# Patient Record
Sex: Male | Born: 1955 | Race: White | Hispanic: No | Marital: Married | Smoking: Never smoker
Health system: Southern US, Community
[De-identification: ages and names within clinical notes are randomized; demographics above are authoritative.]

## PROBLEM LIST (undated history)

## (undated) DIAGNOSIS — Z87442 Personal history of urinary calculi: Secondary | ICD-10-CM

## (undated) DIAGNOSIS — R351 Nocturia: Secondary | ICD-10-CM

## (undated) DIAGNOSIS — C61 Malignant neoplasm of prostate: Secondary | ICD-10-CM

## (undated) DIAGNOSIS — K219 Gastro-esophageal reflux disease without esophagitis: Secondary | ICD-10-CM

## (undated) DIAGNOSIS — I10 Essential (primary) hypertension: Secondary | ICD-10-CM

## (undated) DIAGNOSIS — Z8739 Personal history of other diseases of the musculoskeletal system and connective tissue: Secondary | ICD-10-CM

## (undated) HISTORY — PX: CYSTOSCOPY/RETROGRADE/URETEROSCOPY: SHX5316

---

## 2009-06-07 ENCOUNTER — Emergency Department (HOSPITAL_COMMUNITY): Admission: RE | Admit: 2009-06-07 | Discharge: 2009-06-07 | Payer: Self-pay | Admitting: Internal Medicine

## 2009-07-24 ENCOUNTER — Ambulatory Visit (HOSPITAL_COMMUNITY): Admission: RE | Admit: 2009-07-24 | Discharge: 2009-07-24 | Payer: Self-pay | Admitting: Urology

## 2009-07-31 ENCOUNTER — Ambulatory Visit (HOSPITAL_COMMUNITY): Admission: RE | Admit: 2009-07-31 | Discharge: 2009-07-31 | Payer: Self-pay | Admitting: Urology

## 2010-07-31 IMAGING — CT CT ABDOMEN W/O CM
2 of 4 series · 16 of 46 positions shown, 18 images · non-contrast
Comparison: None

CT ABDOMEN

CLINICAL DATA: Left flank pain.  History kidney stones.

CT OF THE ABDOMEN AND PELVIS WITHOUT CONTRAST (CT UROGRAM)
TECHNIQUE: Multidetector CT imaging was performed through the
abdomen and pelvis to include the urinary tract.

[Series 2: stone <45 and or <(id) 5.0 mm · axial · 0.79mm/px · z∈[-496,-36]mm · 13 of 102 slices shown, 15 images]
[im 5/102  soft-tissue]
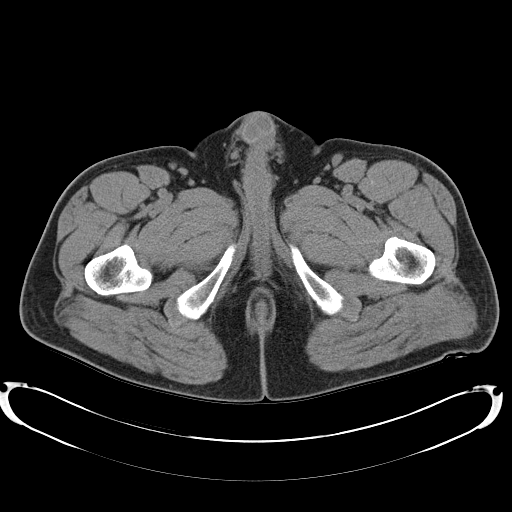
[im 5/102  bone]
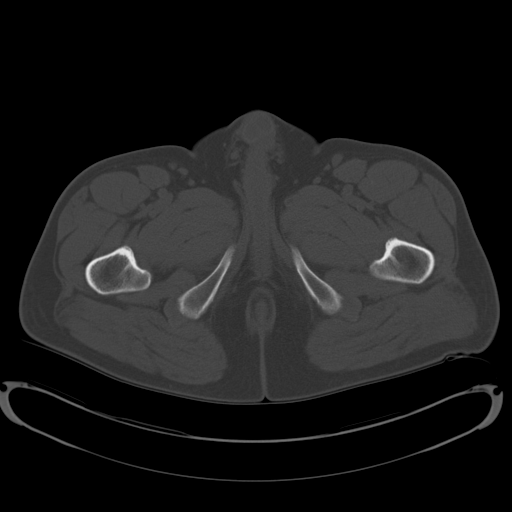
[im 13/102  soft-tissue]
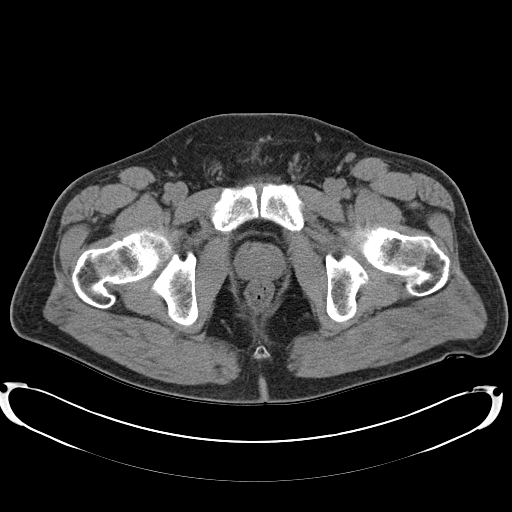
[im 21/102  soft-tissue]
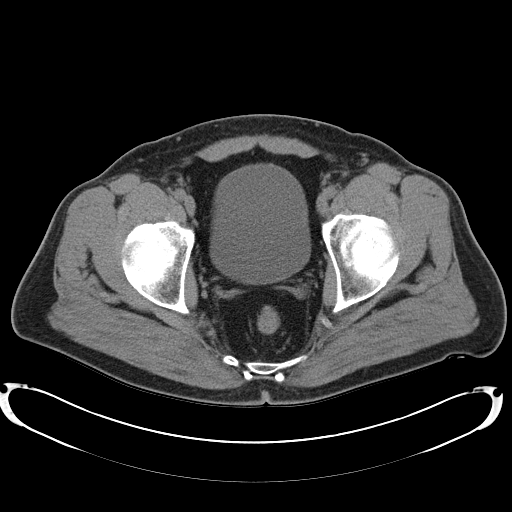
[im 29/102  soft-tissue]
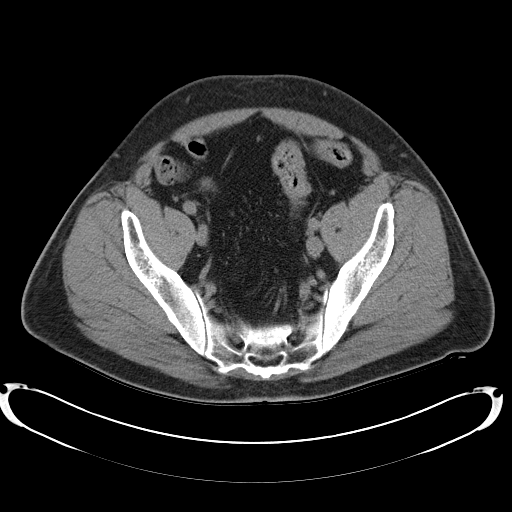
[im 37/102  soft-tissue]
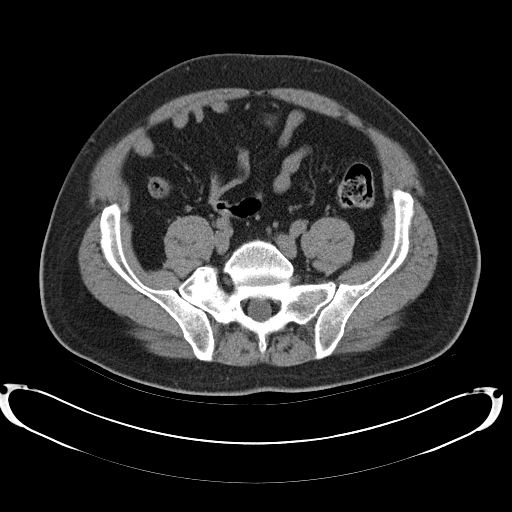
[im 45/102  soft-tissue]
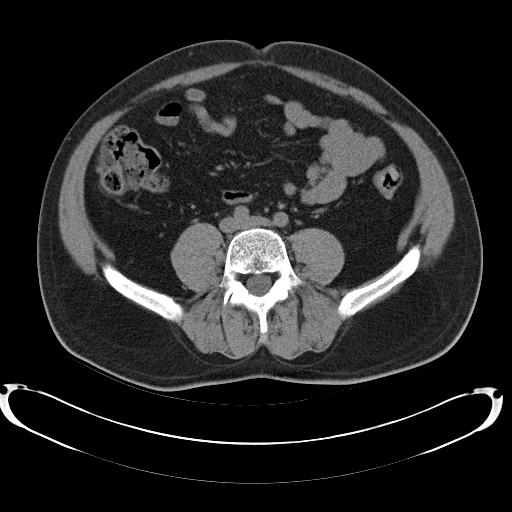
[im 53/102  soft-tissue]
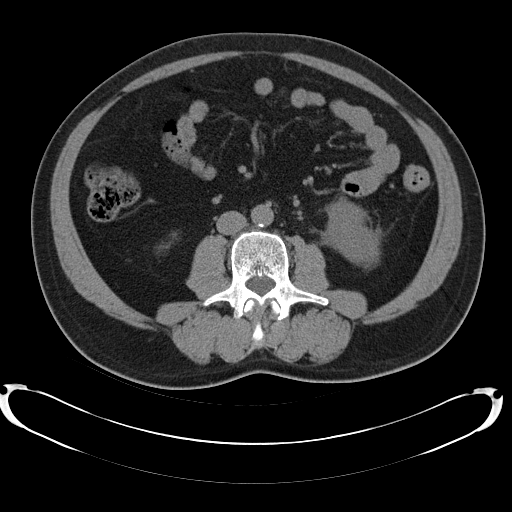
[im 57/102  soft-tissue]
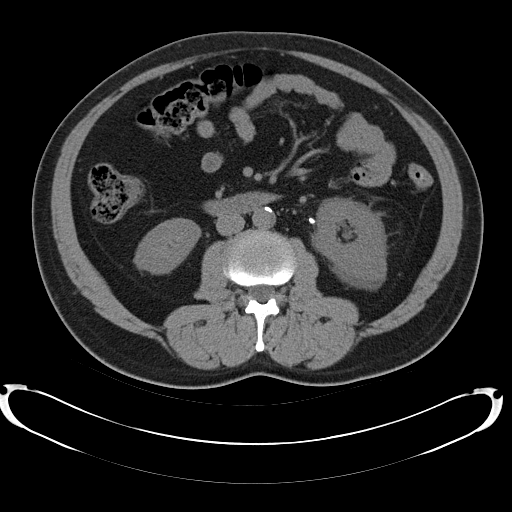
[im 65/102  soft-tissue]
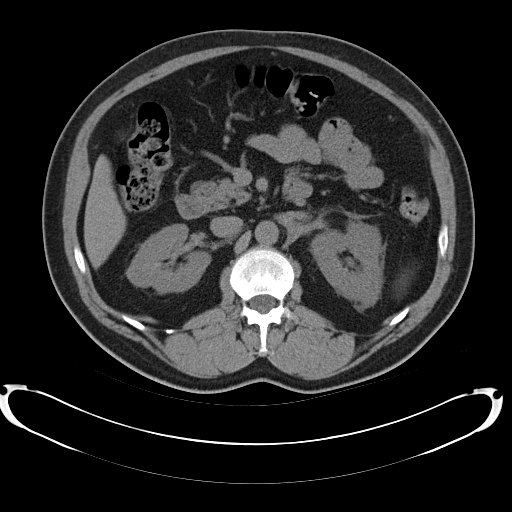
[im 65/102  bone]
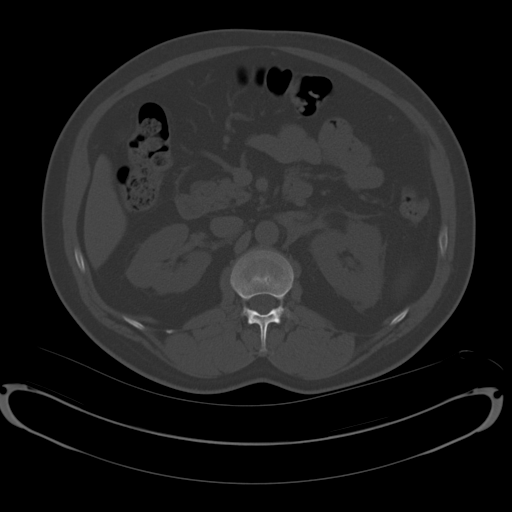
[im 73/102  soft-tissue]
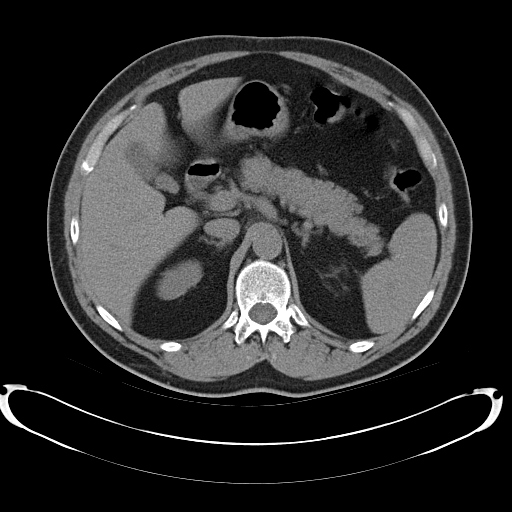
[im 81/102  soft-tissue]
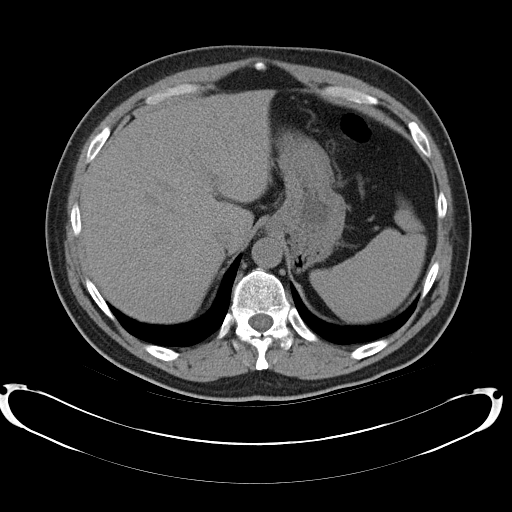
[im 89/102  soft-tissue]
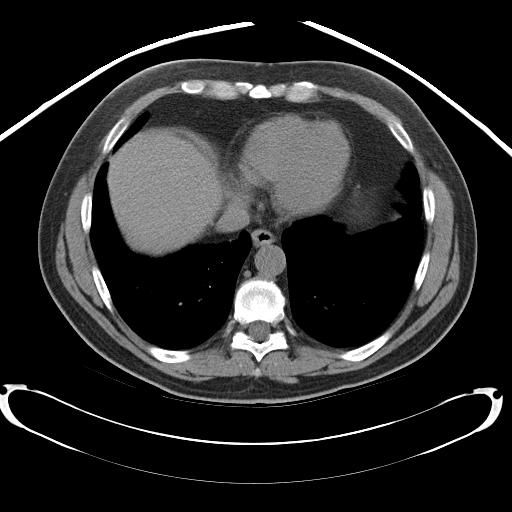
[im 97/102  soft-tissue]
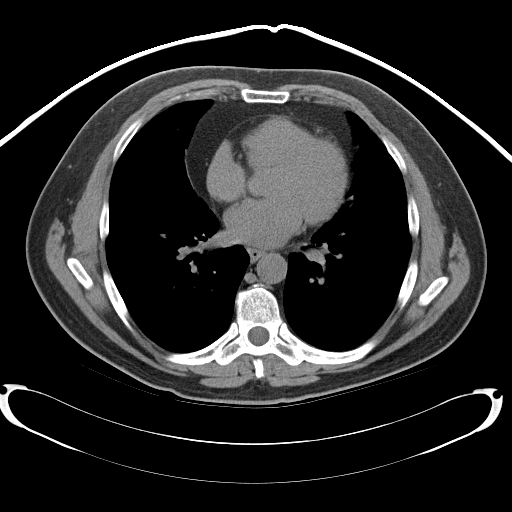

[Series 4: mpr coronal · coronal · 0.71mm/px · 3 of 90 slices shown]
[im 30/90  soft-tissue]
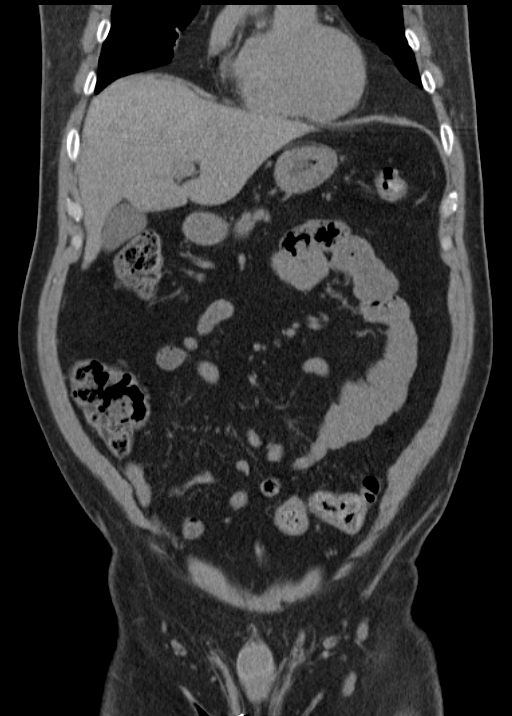
[im 40/90  soft-tissue]
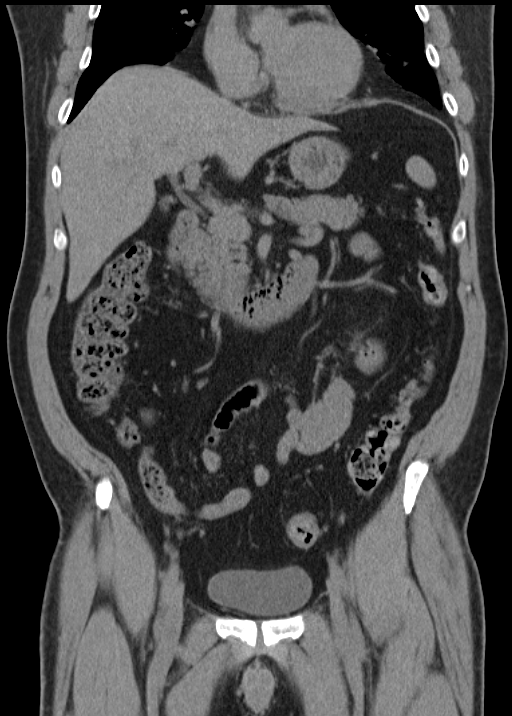
[im 50/90  soft-tissue]
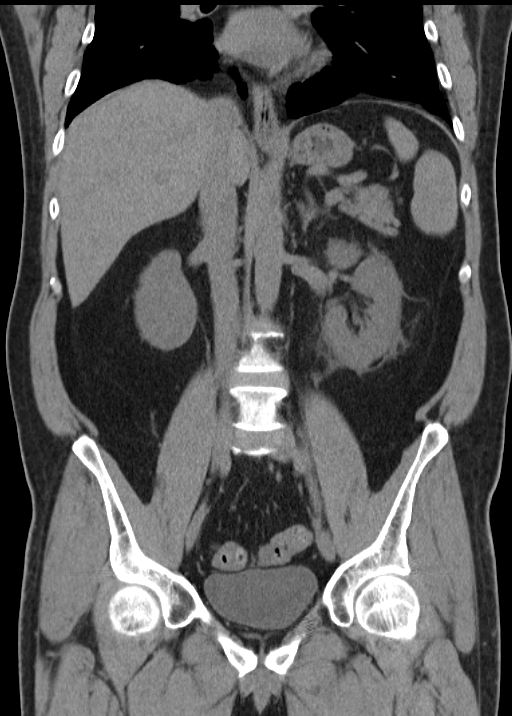

[16 of 46 positions shown; findings below may reference images not displayed]

FINDINGS: There is acute obstruction of the left kidney with
minimal hydronephrosis noted.  Streaky soft tissue densities
involve the perirenal fat.  Bilateral single small intrarenal
calculi lying within the collecting systems.  5 x 6 mm calculus in
the proximal left ureter.
IMPRESSION: Obstruction of the proximal left ureter by a small calculus.

CT PELVIS
FINDINGS: Normal urinary bladder.  Sigmoid diverticula.  No free
pelvic fluid.  No acute pelvic findings.  Subtle L5 pars defects
without slippage.
IMPRESSION: No acute pelvic findings.

## 2011-01-28 ENCOUNTER — Other Ambulatory Visit (HOSPITAL_COMMUNITY): Payer: Self-pay | Admitting: Urology

## 2011-01-28 DIAGNOSIS — N23 Unspecified renal colic: Secondary | ICD-10-CM

## 2011-01-30 ENCOUNTER — Other Ambulatory Visit (HOSPITAL_COMMUNITY): Payer: Self-pay

## 2011-04-06 LAB — BASIC METABOLIC PANEL
BUN: 14 mg/dL (ref 6–23)
CO2: 30 mEq/L (ref 19–32)
Calcium: 9.6 mg/dL (ref 8.4–10.5)
Chloride: 103 mEq/L (ref 96–112)
Creatinine, Ser: 1.15 mg/dL (ref 0.4–1.5)
GFR calc Af Amer: >60 mL/min (ref >60)
GFR calc non Af Amer: >60 mL/min (ref >60)
Glucose, Bld: 84 mg/dL (ref 70–99)
Potassium: 4.7 mEq/L (ref 3.5–5.1)
Sodium: 139 mEq/L (ref 135–145)

## 2011-04-06 LAB — HEMOGLOBIN AND HEMATOCRIT, BLOOD
HCT: 41.5 % (ref 39.0–52.0)
Hemoglobin: 14.9 g/dL (ref 13.0–17.0)

## 2011-05-13 NOTE — H&P (Signed)
[name], [name]             ACCOUNT NO.:  [id number]   MEDICAL RECORD NO.:  [id number]          PATIENT TYPE:  AMB   LOCATION:  DAY                           FACILITY:  [facility]   PHYSICIAN:  Ky Barban, M.D.DATE OF BIRTH:  [birth year]   DATE OF ADMISSION:  DATE OF DISCHARGE:  LH                              HISTORY & PHYSICAL   CHIEF COMPLAINT:  Left renal colic.   HISTORY:  A 55 year old gentleman was seen by me on June 23.  He went to  emergency room at [facility] with left renal colic on June 10.  CT scan  shows 5  x 6 mm stone in the left upper ureter causing no obstruction.  When I saw him he was not having much pain, so I treated him  conservatively,  put him on Flomax.  He came back to see me on July 27  and continued to have on and off pain and wants it get rid of the stone.  KUB showed the calcification which was in the left upper ureter.  It is  now in the area of the distal ureter, although I cannot be 100% sure  because just a KUB.  I told him that I will do a retrograde pyelogram  and if there is a stone we will go after that with a stone basket and  try to remove it.  I have explained the procedure limitation,  complications to the patient especially ureteral perforation leading to  open surgery.  No guarantees about the results.  He understands, wants  me to go ahead and proceed.  He also knows that I may have to use a  double-J stent.   PAST MEDICAL HISTORY:  He has passed a kidney stone several years ago.  No other medical problem.   PERSONAL HISTORY:  He does not smoke or drink.   REVIEW OF SYSTEMS:  Denies any chest pain, orthopnea, PND, nausea,  vomiting, diarrhea, constipation.   EXAMINATION:  Well-nourished, well-developed male not in acute distress.  Blood pressure 136/85, temperature 97.7.  CENTRAL NERVOUS SYSTEM:  Negative.  HEAD/NECK/ENT:  Negative.  CHEST:  Symmetrical.  HEART:  Regular sinus rhythm, no murmur.  ABDOMEN:  Soft, flat.   Liver, spleen, kidneys not palpable.  No CVA  tenderness.  EXTERNAL GENITALIA:  Circumcised, meatus adequate.  Testicles are  normal.  RECTAL EXAM:  Normal sphincter tone.  No rectal mass.  Prostate 1+  smooth and firm.   IMPRESSION:  He understands and wants me to go ahead.  He is coming as  an outpatient.  We will do the procedure tomorrow under anesthesia in  the hospital.      Ky Barban, M.D.  Electronically Signed     MIJ/MEDQ  D:  07/30/2009  T:  07/30/2009  Job:  [id number]

## 2011-05-13 NOTE — Op Note (Signed)
NAMEKEON, PENDER             ACCOUNT NO.:  [id number]   MEDICAL RECORD NO.:  [id number]          PATIENT TYPE:  AMB   LOCATION:  DAY                           FACILITY:  [facility]   PHYSICIAN:  Ky Barban, M.D.DATE OF BIRTH:  02/27/1956   DATE OF PROCEDURE:  07/31/2009  DATE OF DISCHARGE:                               OPERATIVE REPORT   PREOPERATIVE DIAGNOSIS:  Left ureteral calculus.   POSTOPERATIVE DIAGNOSIS:  No ureteral calculus.   PROCEDURE:  Cystoscopy, left retrograde pyelogram, left ureteroscopy.   ANESTHESIA:  General.   PROCEDURE:  The patient placed under general anesthesia in lithotomy  position.  After, usual prep and drape #25 cystoscope introduced into  the bladder.  Bladder was inspected.  The left ureteral orifice looks  quite inflamed of the meatus and it was catheterized with a wedge  catheter.  Hypaque was injected under fluoroscopic control.  The ureter  was dilated moderately above the ureteropelvic junction, but I do not  see any filling defect.  Rest of the ureter also looks normal.  I  decided to look into the ureter.  A guidewire was passed up into the  renal pelvis, over the guidewire #15 balloon dilator was introduced into  the intramural ureter and it is dilated.  Then, over the guidewire short  rigid ureteroscope was introduced and the distal ureter was inspected.  There was a lot of inflammation in the intramural ureter, but no  definite stone.  Rest of the ureter also looks normal.  No stone, and I  think he recently passed that stone and the bladder was thoroughly  inspected.  There is no stone in the bladder either.  All the  instruments were removed.  The patient left the operating room in the  satisfactory condition.      Ky Barban, M.D.  Electronically Signed     MIJ/MEDQ  D:  07/31/2009  T:  07/31/2009  Job:  [id number]

## 2011-10-16 ENCOUNTER — Encounter (INDEPENDENT_AMBULATORY_CARE_PROVIDER_SITE_OTHER): Payer: Self-pay | Admitting: *Deleted

## 2013-06-15 ENCOUNTER — Other Ambulatory Visit (HOSPITAL_COMMUNITY): Payer: Self-pay | Admitting: Cardiovascular Disease

## 2013-06-15 DIAGNOSIS — R079 Chest pain, unspecified: Secondary | ICD-10-CM

## 2013-06-15 DIAGNOSIS — R011 Cardiac murmur, unspecified: Secondary | ICD-10-CM

## 2013-06-16 ENCOUNTER — Other Ambulatory Visit (HOSPITAL_COMMUNITY): Payer: Self-pay | Admitting: Cardiovascular Disease

## 2013-06-16 ENCOUNTER — Encounter: Payer: Self-pay | Admitting: Cardiovascular Disease

## 2013-06-16 DIAGNOSIS — R011 Cardiac murmur, unspecified: Secondary | ICD-10-CM

## 2013-06-16 DIAGNOSIS — R079 Chest pain, unspecified: Secondary | ICD-10-CM

## 2013-06-23 ENCOUNTER — Other Ambulatory Visit: Payer: Self-pay | Admitting: Cardiovascular Disease

## 2013-06-23 ENCOUNTER — Ambulatory Visit (HOSPITAL_COMMUNITY): Payer: Self-pay

## 2013-06-23 ENCOUNTER — Ambulatory Visit (HOSPITAL_COMMUNITY)
Admission: RE | Admit: 2013-06-23 | Discharge: 2013-06-23 | Disposition: A | Discharge status: Home / Self Care | Payer: BC Managed Care – PPO | Source: Ambulatory Visit | Attending: Internal Medicine | Admitting: Internal Medicine

## 2013-06-23 DIAGNOSIS — I2510 Atherosclerotic heart disease of native coronary artery without angina pectoris: Secondary | ICD-10-CM | POA: Insufficient documentation

## 2013-06-23 DIAGNOSIS — R011 Cardiac murmur, unspecified: Secondary | ICD-10-CM

## 2013-06-23 DIAGNOSIS — I10 Essential (primary) hypertension: Secondary | ICD-10-CM | POA: Insufficient documentation

## 2013-06-23 DIAGNOSIS — R079 Chest pain, unspecified: Secondary | ICD-10-CM | POA: Insufficient documentation

## 2013-06-23 DIAGNOSIS — I379 Nonrheumatic pulmonary valve disorder, unspecified: Secondary | ICD-10-CM | POA: Insufficient documentation

## 2013-06-23 DIAGNOSIS — I059 Rheumatic mitral valve disease, unspecified: Secondary | ICD-10-CM | POA: Insufficient documentation

## 2013-06-23 NOTE — Progress Notes (Signed)
2D Echo Performed 06/23/2013    Jestine Bicknell, RCS  

## 2013-07-07 ENCOUNTER — Ambulatory Visit (HOSPITAL_COMMUNITY)
Admission: RE | Admit: 2013-07-07 | Discharge: 2013-07-07 | Disposition: A | Discharge status: Home / Self Care | Payer: BC Managed Care – PPO | Source: Ambulatory Visit | Attending: Cardiovascular Disease | Admitting: Cardiovascular Disease

## 2013-07-07 DIAGNOSIS — I10 Essential (primary) hypertension: Secondary | ICD-10-CM | POA: Insufficient documentation

## 2013-07-07 DIAGNOSIS — R079 Chest pain, unspecified: Secondary | ICD-10-CM | POA: Insufficient documentation

## 2013-07-07 DIAGNOSIS — Z8249 Family history of ischemic heart disease and other diseases of the circulatory system: Secondary | ICD-10-CM | POA: Insufficient documentation

## 2013-07-07 DIAGNOSIS — R0989 Other specified symptoms and signs involving the circulatory and respiratory systems: Secondary | ICD-10-CM | POA: Insufficient documentation

## 2013-07-07 DIAGNOSIS — R0609 Other forms of dyspnea: Secondary | ICD-10-CM | POA: Insufficient documentation

## 2013-07-07 MED ORDER — TECHNETIUM TC 99M SESTAMIBI GENERIC - CARDIOLITE
10.1000 | Freq: Once | INTRAVENOUS | Status: AC | PRN
  Administered 2013-07-07: 10.1 via INTRAVENOUS

## 2013-07-07 MED ORDER — TECHNETIUM TC 99M SESTAMIBI GENERIC - CARDIOLITE
30.6000 | Freq: Once | INTRAVENOUS | Status: AC | PRN
  Administered 2013-07-07: 31 via INTRAVENOUS

## 2013-07-07 NOTE — Procedures (Addendum)
[facility] [address] [name] [place] [address] [phone]  Cardiology Nuclear Med Study  [name] is a 57 y.o. male     MRN : [id number]     DOB: [birth year]  Procedure Date: 07/07/2013  Nuclear Med Background Indication for Stress Test:  Evaluation for Ischemia History:  NO PRIOR HISTORY REPORTED Cardiac Risk Factors: Family History - CAD, Hypertension, Lipids and Overweight  Symptoms:  Chest Pain and DOE   Nuclear Pre-Procedure Caffeine/Decaff Intake:  7:00pm NPO After: 5:00am   IV Site: R Forearm  IV 0.9% NS with Angio Cath:  22g  Chest Size (in):  44"  IV Started by: Emmit Pomfret, RN  Height: 5\' 9"  (1.753 m)  Cup Size: n/a  BMI:  Body mass index is 28.78 kg/(m^2). Weight:  195 lb (88.451 kg)   Tech Comments:  N/A    Nuclear Med Study 1 or 2 day study: 1 day  Stress Test Type:  Stress  Order Authorizing Provider:  Susa Griffins, MD   Resting Radionuclide: Technetium 33m Sestamibi  Resting Radionuclide Dose: 10.1 mCi   Stress Radionuclide:  Technetium 25m Sestamibi  Stress Radionuclide Dose: 30.6 mCi           Stress Protocol Rest HR: 67 Stress HR: 155  Rest BP: 142/91 Stress BP: 204/79  Exercise Time (min): 10:43 METS: 12.9   Predicted Max HR: 162 bpm % Max HR: 95.68 bpm Rate Pressure Product: 03474  Dose of Adenosine (mg):  n/a Dose of Lexiscan: n/a mg  Dose of Atropine (mg): n/a Dose of Dobutamine: n/a mcg/kg/min (at max HR)  Stress Test Technologist: Esperanza Sheets, CCT Nuclear Technologist: Gonzella Lex, CNMT   Rest Procedure:  Myocardial perfusion imaging was performed at rest 45 minutes following the intravenous administration of Technetium 67m Sestamibi. Stress Procedure:  The patient performed treadmill exercise using a Bruce  Protocol for 10:43 minutes. The patient stopped due to SOB and fatigue and denied any chest pain.  There were no significant ST-T wave changes.  Technetium  45m Sestamibi was injected at peak exercise and myocardial perfusion imaging was performed after a brief delay.  Transient Ischemic Dilatation (Normal <1.22):  0.88 Lung/Heart Ratio (Normal <0.45):  0.40 QGS EDV:  63 ml QGS ESV:  20 ml LV Ejection Fraction: 69%  Signed by       Rest ECG: NSR - Normal EKG  Stress ECG: No significant change from baseline ECG  QPS Raw Data Images:  Normal; no motion artifact; normal heart/lung ratio. Stress Images:  Normal homogeneous uptake in all areas of the myocardium. Rest Images:  Normal homogeneous uptake in all areas of the myocardium. Subtraction (SDS):  No evidence of ischemia.  Impression Exercise Capacity:  Excellent exercise capacity. BP Response:  Normal blood pressure response. Clinical Symptoms:  No significant symptoms noted. ECG Impression:  No significant ST segment change suggestive of ischemia. Comparison with Prior Nuclear Study: No images to compare  Overall Impression:  Normal stress nuclear study.  LV Wall Motion:  NL LV Function; NL Wall Motion   Runell Gess, MD  07/07/2013 5:38 PM

## 2013-07-12 NOTE — H&P (Signed)
  NTS SOAP Note  Vital Signs:  Vitals as of: 07/12/2013: Systolic 124: Diastolic 76: Heart Rate 74: Temp 103F: Height 26ft 8in: Weight 195Lbs 0 Ounces: BMI 29.65  BMI : 29.65 kg/m2  Subjective: This 57 Years old Male presents Male presents for a screening TCS.  Never has had a colonoscopy.  Denies any GI complaints.  No family h/o colon cancer.   Review of Symptoms:  Constitutional:unremarkable   Head:unremarkable    Eyes:unremarkable   Nose/Mouth/Throat:unremarkable Cardiovascular:  unremarkable   Respiratory:unremarkable   Gastrointestinal:  unremarkable   Genitourinary:unremarkable     Musculoskeletal:unremarkable   Skin:unremarkable Hematolgic/Lymphatic:unremarkable     Allergic/Immunologic:unremarkable     Past Medical History:    Reviewed   Past Medical History  Surgical History: unremarkable Medical Problems: none Allergies: nkda Medications: none   Social History:Reviewed  Social History  Preferred Language: English Race:  White Ethnicity: Not Hispanic / Latino Age: 57 Years 2 Months Marital Status:  S Alcohol:  No Recreational drug(s):  No   Smoking Status: Never smoker reviewed on 07/12/2013 Functional Status reviewed on mm/dd/yyyy ------------------------------------------------ Bathing: Normal Cooking: Normal Dressing: Normal Driving: Normal Eating: Normal Managing Meds: Normal Oral Care: Normal Shopping: Normal Toileting: Normal Transferring: Normal Walking: Normal Cognitive Status reviewed on mm/dd/yyyy ------------------------------------------------ Attention: Normal Decision Making: Normal Language: Normal Memory: Normal Motor: Normal Perception: Normal Problem Solving: Normal Visual and Spatial: Normal   Family History:  Reviewed  Family Health History Mother, Deceased; Diabetes mellitus, unspecified type;  Father, Deceased; Heart attack (myocardial infarction);     Objective  Information: General:  Well appearing, well nourished in no distress. Heart:  RRR, no murmur Lungs:    CTA bilaterally, no wheezes, rhonchi, rales.  Breathing unlabored. Abdomen:Soft, NT/ND, no HSM, no masses.   deferred to procedure  Assessment:Need for screening TCS  Diagnosis &amp; Procedure Smart Code   Plan:Scheduled for TCS on 07/22/13.   Patient Education:Alternative treatments to surgery were discussed with patient (and family).  Risks and benefits  of procedure including bleeding and perforation were fully explained to the patient (and family) who gave informed consent. Patient/family questions were addressed.  Follow-up:Pending Surgery

## 2013-07-22 ENCOUNTER — Encounter (HOSPITAL_COMMUNITY)
Admission: RE | Disposition: A | Discharge status: Home / Self Care | Payer: Self-pay | Source: Ambulatory Visit | Attending: General Surgery

## 2013-07-22 ENCOUNTER — Encounter (HOSPITAL_COMMUNITY): Payer: Self-pay | Admitting: *Deleted

## 2013-07-22 ENCOUNTER — Ambulatory Visit (HOSPITAL_COMMUNITY)
Admission: RE | Admit: 2013-07-22 | Discharge: 2013-07-22 | Disposition: A | Discharge status: Home / Self Care | Payer: BC Managed Care – PPO | Source: Ambulatory Visit | Attending: General Surgery | Admitting: General Surgery

## 2013-07-22 DIAGNOSIS — Z833 Family history of diabetes mellitus: Secondary | ICD-10-CM | POA: Insufficient documentation

## 2013-07-22 DIAGNOSIS — Z8249 Family history of ischemic heart disease and other diseases of the circulatory system: Secondary | ICD-10-CM | POA: Insufficient documentation

## 2013-07-22 DIAGNOSIS — K5730 Diverticulosis of large intestine without perforation or abscess without bleeding: Secondary | ICD-10-CM | POA: Insufficient documentation

## 2013-07-22 DIAGNOSIS — Z1211 Encounter for screening for malignant neoplasm of colon: Secondary | ICD-10-CM | POA: Insufficient documentation

## 2013-07-22 HISTORY — PX: COLONOSCOPY: SHX5424

## 2013-07-22 HISTORY — DX: Essential (primary) hypertension: I10

## 2013-07-22 SURGERY — COLONOSCOPY
Anesthesia: Moderate Sedation

## 2013-07-22 MED ORDER — SODIUM CHLORIDE 0.9 % IV SOLN
INTRAVENOUS | Status: DC
  Administered 2013-07-22: 07:00:00 via INTRAVENOUS

## 2013-07-22 MED ORDER — MEPERIDINE HCL 25 MG/ML IJ SOLN
INTRAMUSCULAR | Status: DC | PRN
  Administered 2013-07-22: 50 mg via INTRAVENOUS

## 2013-07-22 MED ORDER — MEPERIDINE HCL 100 MG/ML IJ SOLN
INTRAMUSCULAR | Status: AC
  Filled 2013-07-22: qty 1

## 2013-07-22 MED ORDER — MIDAZOLAM HCL 5 MG/5ML IJ SOLN
INTRAMUSCULAR | Status: DC | PRN
  Administered 2013-07-22: 4 mg via INTRAVENOUS

## 2013-07-22 MED ORDER — MIDAZOLAM HCL 5 MG/5ML IJ SOLN
INTRAMUSCULAR | Status: AC
  Filled 2013-07-22: qty 10

## 2013-07-22 NOTE — Op Note (Signed)
[facility] [address] [place], [address]   COLONOSCOPY PROCEDURE REPORT  PATIENT: Scott, Baird  MR#: [id number] BIRTHDATE: 10/22/1956 , 57  yrs. old GENDER: Male ENDOSCOPIST: Franky Macho, MD REFERRED LK:GMWNUUV, John PROCEDURE DATE:  07/22/2013 PROCEDURE:   Colonoscopy, screening ASA CLASS:   Class I INDICATIONS:Average risk patient for colon cancer. MEDICATIONS: Versed 4 mg IV and Demerol 50 mg IV  DESCRIPTION OF PROCEDURE:   After the risks benefits and alternatives of the procedure were thoroughly explained, informed consent was obtained.  A digital rectal exam revealed no abnormalities of the rectum.   The EC-[id number] ([id number])  endoscope was introduced through the anus and advanced to the cecum, which was identified by both the appendix and ileocecal valve. No adverse events experienced.   The quality of the prep was adequate, using MoviPrep  The instrument was then slowly withdrawn as the colon was fully examined.      COLON FINDINGS: Mild diverticulosis was noted in the sigmoid colon. The colon mucosa was otherwise normal.  Retroflexed views revealed no abnormalities. The time to cecum=2 minutes 0 seconds. Withdrawal time=6 minutes 0 seconds.  The scope was withdrawn and the procedure completed. COMPLICATIONS: There were no complications.  ENDOSCOPIC IMPRESSION: 1.   Mild diverticulosis was noted in the sigmoid colon 2.   The colon mucosa was otherwise normal  RECOMMENDATIONS: Repeat Colonscopy in 10 years.   eSigned:  Franky Macho, MD 07/22/2013 7:43 AM   cc:

## 2013-07-22 NOTE — Interval H&P Note (Signed)
History and Physical Interval Note:  07/22/2013 7:23 AM  [name]  has presented today for surgery, with the diagnosis of screening  The various methods of treatment have been discussed with the patient and family. After consideration of risks, benefits and other options for treatment, the patient has consented to  Procedure(s): COLONOSCOPY (N/A) as a surgical intervention .  The patient's history has been reviewed, patient examined, no change in status, stable for surgery.  I have reviewed the patient's chart and labs.  Questions were answered to the patient's satisfaction.     Franky Macho A

## 2013-07-25 ENCOUNTER — Encounter (HOSPITAL_COMMUNITY): Payer: Self-pay | Admitting: General Surgery

## 2015-07-19 ENCOUNTER — Encounter: Payer: Self-pay | Admitting: *Deleted

## 2020-10-08 ENCOUNTER — Other Ambulatory Visit: Payer: Self-pay

## 2020-11-21 ENCOUNTER — Ambulatory Visit: Payer: Self-pay | Admitting: Urology

## 2020-11-28 ENCOUNTER — Ambulatory Visit: Payer: Self-pay | Admitting: Urology

## 2021-01-16 ENCOUNTER — Other Ambulatory Visit: Payer: Self-pay

## 2021-01-16 ENCOUNTER — Encounter: Payer: Self-pay | Admitting: Urology

## 2021-01-16 ENCOUNTER — Ambulatory Visit (INDEPENDENT_AMBULATORY_CARE_PROVIDER_SITE_OTHER): Payer: Medicare Other | Admitting: Urology

## 2021-01-16 VITALS — BP 154/77 | HR 95 | Temp 98.8°F | Ht 68.0 in | Wt 190.0 lb

## 2021-01-16 DIAGNOSIS — R972 Elevated prostate specific antigen [PSA]: Secondary | ICD-10-CM | POA: Diagnosis not present

## 2021-01-16 LAB — URINALYSIS, ROUTINE W REFLEX MICROSCOPIC
Bilirubin, UA: NEGATIVE
Glucose, UA: NEGATIVE
Ketones, UA: NEGATIVE
Leukocytes,UA: NEGATIVE
Nitrite, UA: NEGATIVE
RBC, UA: NEGATIVE
Specific Gravity, UA: 1.025 (ref 1.005–1.030)
Urobilinogen, Ur: 0.2 mg/dL (ref 0.2–1.0)
pH, UA: 5 (ref 5.0–7.5)

## 2021-01-16 LAB — MICROSCOPIC EXAMINATION
Bacteria, UA: NONE SEEN
Epithelial Cells (non renal): NONE SEEN /hpf (ref 0–10)
RBC, Urine: NONE SEEN /hpf (ref 0–2)
Renal Epithel, UA: NONE SEEN /hpf
WBC, UA: NONE SEEN /hpf (ref 0–5)

## 2021-01-16 MED ORDER — LEVOFLOXACIN 750 MG PO TABS: 750.0000 mg | ORAL_TABLET | Freq: Once | ORAL | 0 refills | Status: AC

## 2021-01-16 NOTE — Addendum Note (Signed)
Addended byIris Pert on: 01/16/2021 04:29 PM   Modules accepted: Orders

## 2021-01-16 NOTE — Addendum Note (Signed)
Addended by: Valentina Lucks on: 01/16/2021 04:11 PM   Modules accepted: Orders

## 2021-01-16 NOTE — Patient Instructions (Addendum)
[facility] (11th ed., pp. [phone]). [place], [place]: Elsevier.">  Transrectal Ultrasound-Guided Prostate Biopsy This is a procedure to take samples of tissue from your prostate. Ultrasound images are used to guide the procedure. It is usually done to check for prostate cancer. What happens before the procedure? Eating and drinking restrictions Follow instructions from your doctor about what you can eat or drink. Medicines  Ask your doctor about changing or stopping: ? Your normal medicines. ? Vitamins, herbs, and supplements. ? Over-the-counter medicines.  Do not take aspirin or ibuprofen unless you are told to. General instructions  Liquid will be used to clear waste from your butt (enema).  You may have a blood sample taken.  You may have a pee (urine) sample taken.  Plan to have a responsible adult take you home from the hospital or clinic.  If you will be going home right after the procedure, plan to have a responsible adult care for you for the time you are told. This is important.  For your safety, your doctor may: ? Ask you to wash with a soap that kills germs. ? Give you antibiotic medicine. What happens during the procedure?  You will be given one or both of these: ? A medicine to help you relax. ? A medicine to numb the area.  You will be placed on your left side. Your knees may be bent.  A probe with gel on it will be placed in your butt. Pictures will be taken of your prostate and the area around it.  Medicine will be used to numb your prostate.  A needle will be placed in your butt and moved to your prostate.  Prostate tissue will be removed.  The samples will be sent to a lab. The procedure may vary.   What happens after the procedure?  You will be watched until the medicines you were given have worn off.  You may have some pain in your butt. You will be given medicine for it.  Do not drive for 24 hours if you were given a medicine  to help you relax. Summary  This procedure is usually done to check for prostate cancer.  Before the procedure, ask your doctor about changing or stopping your medicines.  You may have some pain in your butt. You will be given medicine for it.  Plan to have a responsible adult take you home from the hospital or clinic. This information is not intended to replace advice given to you by your health care provider. Make sure you discuss any questions you have with your health care provider. Document Revised: 09/28/2020 Document Reviewed: 08/31/2020 Elsevier Patient Education  [place].           Appointment Time:12:15 pm Please arrive by 12 noon Appointment Date:01/30/2021  Location: [facility]   Prostate Biopsy Instructions  Stop all aspirin or blood thinners (aspirin, plavix, coumadin, warfarin, motrin, ibuprofen, advil, aleve, naproxen, naprosyn) for 7 days prior to the procedure.  If you have any questions about stopping these medications, please contact your primary care physician or cardiologist.  Having a light meal prior to the procedure is recommended.  If you are diabetic or have low blood sugar please bring a small snack or glucose tablet.  A Fleets enema is needed to be purchased over the counter at a local pharmacy and used 2 hours before you scheduled appointment.  This can be purchased over the counter at any pharmacy.  Antibiotics will be administered  in the clinic at the time of the procedure and 1 tablet has been sent to your pharmacy. Please take the antibiotic as prescribed.    Please bring someone with you to the procedure to drive you home if you are given a valium to take prior to your procedure.   If you have any questions or concerns, please feel free to call the office at [phone] or send a Mychart message.    Thank you, [facility]

## 2021-01-16 NOTE — Progress Notes (Signed)
01/16/2021 3:51 PM   [name] [birth year] [id number]  Referring provider: Sharilyn Sites, MD [address] [place],  [place] [address]  Elevated PSA  HPI: Mr Grider is a 65yo here for evaluation of elevated PSA. PSA was 9.8 in 09/2020. Per patient his PSAs have been normal. He denies any LUTS. No family hx of prostate cancer. No issues with ED. He has a hx of nephrolithiasis and saw Dr. Michela Pitcher previously.    PMH: Past Medical History:  Diagnosis Date  . Hypertension     Surgical History: Past Surgical History:  Procedure Laterality Date  . COLONOSCOPY N/A 07/22/2013   Procedure: COLONOSCOPY;  Surgeon: Jamesetta So, MD;  Location: [facility];  Service: Gastroenterology;  Laterality: N/A;    Home Medications:  Allergies as of 01/16/2021   No Known Allergies     Medication List    as of January 16, 2021  3:51 PM   You have not been prescribed any medications.     Allergies: No Known Allergies  Family History: No family history on file.  Social History:  reports that he has never smoked. He has never used smokeless tobacco. He reports that he does not drink alcohol and does not use drugs.  ROS: All other review of systems were reviewed and are negative except what is noted above in HPI  Physical Exam: BP (!) 154/77   Pulse 95   Temp 98.8 F (37.1 C)   Ht 5\' 8"  (1.727 m)   Wt 190 lb (86.2 kg)   BMI 28.89 kg/m   Constitutional:  Alert and oriented, No acute distress. HEENT: [place] AT, moist mucus membranes.  Trachea midline, no masses. Cardiovascular: No clubbing, cyanosis, or edema. Respiratory: Normal respiratory effort, no increased work of breathing. GI: Abdomen is soft, nontender, nondistended, no abdominal masses GU: No CVA tenderness. Circumcised phallus. No masses/lesions on penis, testis, scrotum. Prostate 30g smooth no nodules no induration.  Lymph: No cervical or inguinal lymphadenopathy. Skin: No rashes, bruises or suspicious  lesions. Neurologic: Grossly intact, no focal deficits, moving all 4 extremities. Psychiatric: Normal mood and affect.  Laboratory Data: Lab Results  Component Value Date   HGB 14.9 07/27/2009   HCT 41.5 07/27/2009    Lab Results  Component Value Date   CREATININE 1.15 07/27/2009    No results found for: PSA  No results found for: TESTOSTERONE  No results found for: HGBA1C  Urinalysis No results found for: COLORURINE, APPEARANCEUR, LABSPEC, [place], GLUCOSEU, HGBUR, BILIRUBINUR, [place], [place], UROBILINOGEN, NITRITE, LEUKOCYTESUR  No results found for: LABMICR, [place], RBCUA, LABEPIT, MUCUS, BACTERIA  Pertinent Imaging:  Results for orders placed during the hospital encounter of 07/24/09  DG Abd 1 View  Narrative Clinical Data: Left ureteral calculus  ABDOMEN - 1 VIEW  Comparison: CT on 06/07/2009  Findings: Two adjacent calculi are now seen in the lower left pelvis in the expected region of the ureterovesicle junction. These measure 3-5 mm in size and have passed distally from the left intrarenal collecting system and UPJ region since prior CT.  IMPRESSION: Two small distal left ureteral calculi, which showed distal migration compared to prior CT.  Provider: Briscoe Burns  No results found for this or any previous visit.  No results found for this or any previous visit.  No results found for this or any previous visit.  No results found for this or any previous visit.  No results found for this or any previous visit.  No results found for  this or any previous visit.  No results found for this or any previous visit.   Assessment & Plan:    1. Elevated PSA The patient and I talked about etiologies of elevated PSA.  We discussed the possible relationship between elevated PSA, prostate cancer, BPH, prostatitis, and UTI.   Conservative treatment of elevated PSA with watchful waiting was discussed with the patient.  All questions were answered.         All of the risks and benefits along with alternatives to prostate biopsy were discussed with the patient.  The patient gave fully informed consent to proceed with a transrectal ultrasound guided biopsy of the prostate for the evaluation of their evated PSA.  Prostate biopsy instructions and antibiotics were given to the patient.    No follow-ups on file.  Nicolette Bang, MD  [facility] [place]

## 2021-01-30 ENCOUNTER — Encounter (HOSPITAL_COMMUNITY): Payer: Self-pay

## 2021-01-30 ENCOUNTER — Other Ambulatory Visit: Payer: Self-pay | Admitting: Urology

## 2021-01-30 ENCOUNTER — Encounter: Payer: Self-pay | Admitting: Urology

## 2021-01-30 ENCOUNTER — Ambulatory Visit (HOSPITAL_COMMUNITY)
Admission: RE | Admit: 2021-01-30 | Discharge: 2021-01-30 | Disposition: A | Discharge status: Home / Self Care | Payer: Medicare Other | Source: Ambulatory Visit | Attending: Urology | Admitting: Urology

## 2021-01-30 ENCOUNTER — Ambulatory Visit (INDEPENDENT_AMBULATORY_CARE_PROVIDER_SITE_OTHER): Payer: Medicare Other | Admitting: Urology

## 2021-01-30 ENCOUNTER — Other Ambulatory Visit: Payer: Self-pay

## 2021-01-30 DIAGNOSIS — R972 Elevated prostate specific antigen [PSA]: Secondary | ICD-10-CM | POA: Diagnosis not present

## 2021-01-30 DIAGNOSIS — C61 Malignant neoplasm of prostate: Secondary | ICD-10-CM | POA: Insufficient documentation

## 2021-01-30 MED ORDER — GENTAMICIN SULFATE 40 MG/ML IJ SOLN: 80.0000 mg | Freq: Once | INTRAMUSCULAR | Status: AC

## 2021-01-30 MED ORDER — GENTAMICIN SULFATE 40 MG/ML IJ SOLN
INTRAMUSCULAR | Status: AC
  Administered 2021-01-30: 80 mg via INTRAMUSCULAR
  Filled 2021-01-30: qty 2

## 2021-01-30 MED ORDER — LIDOCAINE HCL (PF) 2 % IJ SOLN
INTRAMUSCULAR | Status: AC
  Administered 2021-01-30: 10 mL
  Filled 2021-01-30: qty 10

## 2021-01-30 MED ORDER — LIDOCAINE HCL (PF) 2 % IJ SOLN: 10.0000 mL | Freq: Once | INTRAMUSCULAR | Status: AC

## 2021-01-30 NOTE — Progress Notes (Signed)
Prostate Biopsy Procedure   Informed consent was obtained after discussing risks/benefits of the procedure.  A time out was performed to ensure correct patient identity.  Pre-Procedure: - Last PSA Level: No results found for: PSA - Gentamicin given prophylactically - Levaquin 500 mg administered PO -Transrectal Ultrasound performed revealing a 32.1 gm prostate -No significant hypoechoic or median lobe noted  Procedure: - Prostate block performed using 10 cc 1% lidocaine and biopsies taken from sextant areas, a total of 12 under ultrasound guidance.  Post-Procedure: - Patient tolerated the procedure well - He was counseled to seek immediate medical attention if experiences any severe pain, significant bleeding, or fevers - Return in one week to discuss biopsy results

## 2021-01-30 NOTE — Sedation Documentation (Signed)
PT tolerated prostate biopsy procedure well today. Labs obtained and sent for pathology. PT ambulatory at discharge with no acute distress noted and verbalized understanding of discharge instructions. PT to follow up with urologist as scheduled. 

## 2021-01-30 NOTE — Patient Instructions (Signed)

## 2021-01-30 NOTE — Discharge Instructions (Signed)

## 2021-02-11 ENCOUNTER — Other Ambulatory Visit: Payer: Self-pay

## 2021-02-11 ENCOUNTER — Ambulatory Visit (INDEPENDENT_AMBULATORY_CARE_PROVIDER_SITE_OTHER): Payer: Medicare Other | Admitting: Urology

## 2021-02-11 ENCOUNTER — Encounter: Payer: Self-pay | Admitting: Urology

## 2021-02-11 VITALS — BP 154/71 | HR 89 | Temp 98.5°F | Ht 68.0 in | Wt 190.0 lb

## 2021-02-11 DIAGNOSIS — C61 Malignant neoplasm of prostate: Secondary | ICD-10-CM | POA: Diagnosis not present

## 2021-02-11 NOTE — Patient Instructions (Signed)
Prostate Cancer  The prostate is a small gland (1.5 inches [3.8 cm] wide and 1 inch [2.5 cm] high) that is involved in the production of semen. It is located below a man's bladder, in front of the rectum. Prostate cancer is the abnormal growth of cells in the prostate gland. What are the causes? The exact cause of this condition is not known. What increases the risk? You are more likely to develop this condition if:  You are 66 years of age or older.  You are African American.  You have a family history of prostate cancer.  You have a family history of breast cancer. What are the signs or symptoms? Symptoms of this condition include:  A need to urinate often.  Weak or interrupted flow of urine.  Trouble starting or stopping urination.  Inability to urinate.  Blood in urine or semen.  Persistent pain or discomfort in the lower back, lower abdomen, hips, or upper thighs.  Trouble getting an erection.  Trouble emptying the bladder all the way. How is this diagnosed? This condition can be diagnosed with:  A digital rectal exam. For this exam, a health care provider inserts a gloved finger into the rectum to feel the prostate gland.  A blood test called a prostate-specific antigen (PSA) test.  A procedure in which a sample of tissue is taken from the prostate and checked under a microscope (prostate biopsy).  An imaging test called transrectal ultrasonography. Once the condition is diagnosed, tests will be done to determine how far the cancer has spread. This is called staging the cancer. Staging may involve imaging tests, such as:  A bone scan.  A CT scan.  A PET scan.  An MRI. The stages of prostate cancer are as follows:  Stage I. At this stage, the cancer is found in the prostate only. The cancer is not visible on imaging tests, and it is usually found by accident, such as during prostate surgery.  Stage II. At this stage, the cancer is more advanced than it is  in stage I, but the cancer has not spread outside the prostate.  Stage III. At this stage, the cancer has spread beyond the outer layer of the prostate to nearby tissues. The cancer may be found in the seminal vesicles, which are near the bladder and the prostate.  Stage IV. At this stage, the cancer has spread to other parts of the body, such as the lymph nodes, bones, bladder, rectum, liver, or lungs. How is this treated? Treatment for this condition depends on several factors, including the stage of the cancer, your age, personal preferences, and your overall health. Talk with your health care provider about treatment options that are recommended for you. Common treatments include:  Observation for early stage prostate cancer (active surveillance). This involves having exams, blood tests, and in some cases, more biopsies. For some men, this is the only treatment needed.  Surgery. Types of surgeries include: ? Open surgery (prostatectomy). In this surgery, a larger incision is made to remove the prostate. ? A laparoscopic prostatectomy. This is a surgery to remove the prostate and lymph nodes through several, small incisions. It is often referred to as a minimally invasive surgery. ? A robotic prostatectomy. This is laparoscopic surgery to remove the prostate and lymph nodes with the help of robotic arms that are controlled by the surgeon. ? Orchiectomy. This is surgery to remove the testicles. ? Cryosurgery. This is surgery to freeze and destroy cancer cells.  Radiation treatment. Types of radiation treatment include: ? External beam radiation. This type aims beams of radiation from outside the body at the prostate to destroy cancerous cells. ? Brachytherapy. This type uses radioactive needles, seeds, wires, or tubes that are implanted into the prostate gland. Like external beam radiation, brachytherapy destroys cancerous cells. An advantage is that this type of radiation limits the damage to  surrounding tissue and has fewer side effects.  High-intensity, focused ultrasonography. This treatment destroys cancer cells by delivering high-energy ultrasound waves to the cancerous cells.  Chemotherapy medicines. This treatment kills cancer cells or stops them from multiplying. It kills both cancer cells and normal cells.  Targeted therapy. This treatment uses medicines to kill cancer cells without damaging normal cells.  Hormone treatment. This treatment involves taking medicines that act on one of the male hormones (testosterone): ? By stopping your body from producing testosterone. ? By blocking testosterone from reaching cancer cells. Follow these instructions at home:  Take over-the-counter and prescription medicines only as told by your health care provider.  Maintain a healthy diet.  Get plenty of sleep.  Consider joining a support group for men who have prostate cancer. Meeting with a support group may help you learn to manage the stress of having cancer.  If you have to go to the hospital, notify your cancer specialist (oncologist).  Treatment for prostate cancer may affect sexual function. Continue to have intimate moments with your partner. This may include touching, holding, hugging, and caressing.  Keep all follow-up visits as told by your health care provider. This is important. Contact a health care provider if:  You have new or increasing trouble urinating.  You have new or increasing blood in your urine.  You have new or increasing pain in your hips, back, or chest. Get help right away if:  You have weakness or numbness in your legs.  You cannot control urination or your bowel movements (incontinence).  You have chills or a fever. Summary  The prostate is a small gland that is involved in the production of semen. It is located below a man's bladder, in front of the rectum.  Prostate cancer is the abnormal growth of cells in the prostate  gland.  Treatment for this condition depends on the stage of the cancer, your age, personal preferences, and your overall health. Talk with your health care provider about treatment options that are recommended for you.  Consider joining a support group for men who have prostate cancer. Meeting with a support group may help you learn to cope with the stress of having cancer. This information is not intended to replace advice given to you by your health care provider. Make sure you discuss any questions you have with your health care provider. Document Revised: 11/29/2019 Document Reviewed: 11/29/2019 Elsevier Patient Education  2021 [employer].

## 2021-02-11 NOTE — Progress Notes (Signed)
02/11/2021 4:25 PM   [name] [birth year] [id number]  Referring provider: [facility] [place],  [place] [address]  followup prostate biopsy  HPI: [name] is a 65yo here for followup after prostate biopsy. Biopsy revelaed Gleason 4+5=9 in 1/12 cores 50% of cores, Gleason 3+4=7 in 1/12 cores and Gleason 3+3=6 in 4/12 cores. PSA 9.8 Mild LUTS. MIld erectile dysfunction.   PMH: Past Medical History:  Diagnosis Date  . Hypertension     Surgical History: Past Surgical History:  Procedure Laterality Date  . COLONOSCOPY N/A 07/22/2013   Procedure: COLONOSCOPY;  Surgeon: Scott So, MD;  [facility];  Service: Gastroenterology;  Laterality: N/A;    Home Medications:  Allergies as of 02/11/2021   No Known Allergies     Medication List       Accurate as of February 11, 2021  4:25 PM. If you have any questions, ask your nurse or doctor.        albuterol 108 (90 Base) MCG/ACT inhaler Commonly known as: VENTOLIN HFA Inhale 2 puffs into the lungs every 2 (two) hours as needed.   aspirin 81 MG chewable tablet Chew 81 mg by mouth daily.   doxycycline 100 MG capsule Commonly known as: VIBRAMYCIN Take 100 mg by mouth 2 (two) times daily.   Fish Oil 1000 MG Caps Take 1 capsule by mouth daily.   indomethacin 50 MG capsule Commonly known as: INDOCIN TAKE (1) CAPSULE BY MOUTH THREE TIMES DAILY AS NEEDED.   levofloxacin 750 MG tablet Commonly known as: LEVAQUIN Take 750 mg by mouth once.   lisinopril 10 MG tablet Commonly known as: ZESTRIL Take 1 tablet by mouth daily.   predniSONE 10 MG tablet Commonly known as: DELTASONE Take 10 mg by mouth.   Proctosol HC 2.5 % rectal cream Generic drug: hydrocortisone 1 application       Allergies: No Known Allergies  Family History: No family history on file.  Social History:  reports that he has never smoked. He has never used smokeless tobacco. He reports  that he does not drink alcohol and does not use drugs.  ROS: All other review of systems were reviewed and are negative except what is noted above in HPI  Physical Exam: BP (!) 154/71   Pulse 89   Temp 98.5 F (36.9 C)   Ht 5\' 8"  (1.727 m)   Wt 190 lb (86.2 kg)   BMI 28.89 kg/m   Constitutional:  Alert and oriented, No acute distress. HEENT:  AT, moist mucus membranes.  Trachea midline, no masses. Cardiovascular: No clubbing, cyanosis, or edema. Respiratory: Normal respiratory effort, no increased work of breathing. GI: Abdomen is soft, nontender, nondistended, no abdominal masses GU: No CVA tenderness.  Lymph: No cervical or inguinal lymphadenopathy. Skin: No rashes, bruises or suspicious lesions. Neurologic: Grossly intact, no focal deficits, moving all 4 extremities. Psychiatric: Normal mood and affect.  Laboratory Data: Lab Results  Component Value Date   HGB 14.9 07/27/2009   HCT 41.5 07/27/2009    Lab Results  Component Value Date   CREATININE 1.15 07/27/2009    No results found for: PSA  No results found for: TESTOSTERONE  No results found for: HGBA1C  Urinalysis    Component Value Date/Time   APPEARANCEUR Clear 01/16/2021 1612   GLUCOSEU Negative 01/16/2021 1612   BILIRUBINUR Negative 01/16/2021 1612   PROTEINUR Trace (A) 01/16/2021 1612   NITRITE Negative 01/16/2021 1612   LEUKOCYTESUR  Negative 01/16/2021 1612    Lab Results  Component Value Date   LABMICR See below: 01/16/2021   WBCUA None seen 01/16/2021   LABEPIT None seen 01/16/2021   MUCUS Present 01/16/2021   BACTERIA None seen 01/16/2021    Pertinent Imaging:  Results for orders placed during the hospital encounter of 07/24/09  DG Abd 1 View  Narrative Clinical Data: Left ureteral calculus  ABDOMEN - 1 VIEW  Comparison: CT on 06/07/2009  Findings: Two adjacent calculi are now seen in the lower left pelvis in the expected region of the ureterovesicle junction. These  measure 3-5 mm in size and have passed distally from the left intrarenal collecting system and UPJ region since prior CT.  IMPRESSION: Two small distal left ureteral calculi, which showed distal migration compared to prior CT.  Provider: Briscoe Burns  No results found for this or any previous visit.  No results found for this or any previous visit.  No results found for this or any previous visit.  No results found for this or any previous visit.  No results found for this or any previous visit.  No results found for this or any previous visit.  No results found for this or any previous visit.   Assessment & Plan:    1. Prostate cancer [facility]) I discussed the natural history of high risk prostate cancer with the patient and the various treatment options including active surveillance, RALP, IMRT, brachytherapy, cryotherapy, HIFU and ADT. After discussing the options the patient elects for radiation therapy. We will obtain CT and bone scan prior to his appointment with Dr. Tammi Klippel.    No follow-ups on file.  Nicolette Bang, MD  [facility]

## 2021-02-12 LAB — BASIC METABOLIC PANEL
BUN/Creatinine Ratio: 12 (ref 10–24)
BUN: 15 mg/dL (ref 8–27)
CO2: 21 mmol/L (ref 20–29)
Calcium: 9.5 mg/dL (ref 8.6–10.2)
Chloride: 102 mmol/L (ref 96–106)
Creatinine, Ser: 1.29 mg/dL — ABNORMAL HIGH (ref 0.76–1.27)
GFR calc Af Amer: 67 mL/min/{1.73_m2} (ref >59)
GFR calc non Af Amer: 58 mL/min/{1.73_m2} — ABNORMAL LOW (ref >59)
Glucose: 148 mg/dL — ABNORMAL HIGH (ref 65–99)
Potassium: 4.3 mmol/L (ref 3.5–5.2)
Sodium: 142 mmol/L (ref 134–144)

## 2021-02-13 ENCOUNTER — Encounter: Payer: Self-pay | Admitting: Radiation Oncology

## 2021-02-13 DIAGNOSIS — I10 Essential (primary) hypertension: Secondary | ICD-10-CM | POA: Insufficient documentation

## 2021-02-13 DIAGNOSIS — E785 Hyperlipidemia, unspecified: Secondary | ICD-10-CM | POA: Insufficient documentation

## 2021-02-13 DIAGNOSIS — M722 Plantar fascial fibromatosis: Secondary | ICD-10-CM | POA: Insufficient documentation

## 2021-02-13 DIAGNOSIS — M109 Gout, unspecified: Secondary | ICD-10-CM | POA: Insufficient documentation

## 2021-02-13 NOTE — Progress Notes (Signed)
GU Location of Tumor / Histology: prostatic adenocarcinoma  If Prostate Cancer, Gleason Score is (4 + 5) and PSA is (9.8). Prostate volume: 32.1 grams  [name] presented with an elevated PSA, mild LUTS and mild ED.  Biopsies of prostate (if applicable) revealed:   Past/Anticipated interventions by urology, if any: prostate biopsy, CT abd/pelvis (ordered. Scheduled for 03/15/21), bone scan (ordered), referral to Dr. Tammi Klippel  Past/Anticipated interventions by medical oncology, if any: no  Weight changes, if any: denies  Bowel/Bladder complaints, if any: IPSS 5 with urgency. SHIM 23.  Mild LUTS. Mild ED. Denies dysuria, hematuria, urinary leakage or incontinence. Denies an bowel complaints.   Nausea/Vomiting, if any: denies  Pain issues, if any:  denies  SAFETY ISSUES:  Prior radiation? denies  Pacemaker/ICD? denies  Possible current pregnancy? no, male patient  Is the patient on methotrexate? denies  Current Complaints / other details:  64 year old male. Married. Patient had COVID in January.  Scheduled for follow up with Dr. Alyson Ingles on 03/20/21 at 3 pm

## 2021-02-15 ENCOUNTER — Encounter: Payer: Self-pay | Admitting: Radiation Oncology

## 2021-02-15 ENCOUNTER — Other Ambulatory Visit: Payer: Self-pay

## 2021-02-15 ENCOUNTER — Ambulatory Visit
Admission: RE | Admit: 2021-02-15 | Discharge: 2021-02-15 | Disposition: A | Discharge status: Home / Self Care | Payer: Medicare Other | Source: Ambulatory Visit | Attending: Radiation Oncology | Admitting: Radiation Oncology

## 2021-02-15 VITALS — Ht 68.0 in | Wt 185.0 lb

## 2021-02-15 DIAGNOSIS — C61 Malignant neoplasm of prostate: Secondary | ICD-10-CM

## 2021-02-15 HISTORY — DX: Malignant neoplasm of prostate: C61

## 2021-02-15 NOTE — Progress Notes (Signed)
Radiation Oncology         [phone] ________________________________  Initial Outpatient Consultation - Conducted via Telephone due to current COVID-19 concerns for limiting patient exposure  Name: [name] MRN: [id number]  Date: 02/15/2021  DOB: 1956/03/18  CC:[facility] [place], MD   REFERRING PHYSICIAN: Cleon Gustin, MD  DIAGNOSIS: 65 y.o. gentleman with Stage T1c adenocarcinoma of the prostate with Gleason score of 4+5, and PSA of 9.8.    ICD-10-CM   1. Prostate cancer [facility])  C61     HISTORY OF PRESENT ILLNESS: [name] is a 65 y.o. male with a diagnosis of prostate cancer. He was noted to have an elevated PSA of 9.8 by his primary care physician, Dr. Hilma Favors.  Accordingly, he was referred for evaluation in urology by Dr. Alyson Ingles on 01/16/21,  digital rectal examination was performed at that time revealing no nodules.  The patient proceeded to transrectal ultrasound with 12 biopsies of the prostate on 01/30/21.  The prostate volume measured 32.1 cc.  Out of 12 core biopsies, 6 were positive.  The maximum Gleason score was 4+5, and this was seen in the right base (with PNI). Additionally, Gleason 3+4 was seen in the right mid lateral, and small foci of Gleason 3+3 in the right mid, left mid, left apex lateral, and the left mid lateral.  He is scheduled for staging CT A/P on 03/15/21. A bone scan has been ordered but not yet scheduled. He is also scheduled for follow up with Dr. Alyson Ingles on 03/20/21.  The patient reviewed the biopsy results with his urologist and he has kindly been referred today for discussion of potential radiation treatment options.   PREVIOUS RADIATION THERAPY: No  PAST MEDICAL HISTORY:  Past Medical History:  Diagnosis Date  . Hypertension   . Prostate cancer ([place])       PAST SURGICAL HISTORY: Past Surgical History:  Procedure Laterality Date  . COLONOSCOPY N/A 07/22/2013   Procedure:  COLONOSCOPY;  Surgeon: Jamesetta So, MD;  Location[facility];  Service: Gastroenterology;  Laterality: N/A;  . PROSTATE BIOPSY      FAMILY HISTORY:  Family History  Problem Relation Age of Onset  . Breast cancer Sister   . Cervical cancer Sister   . Prostate cancer Neg Hx   . Colon cancer Neg Hx   . Pancreatic cancer Neg Hx     SOCIAL HISTORY:  Social History   Socioeconomic History  . Marital status: Married    Spouse name: Not on file  . Number of children: Not on file  . Years of education: Not on file  . Highest education level: Not on file  Occupational History  . Not on file  Tobacco Use  . Smoking status: Never Smoker  . Smokeless tobacco: Never Used  Vaping Use  . Vaping Use: Never used  Substance and Sexual Activity  . Alcohol use: No  . Drug use: No  . Sexual activity: Yes  Other Topics Concern  . Not on file  Social History Narrative  . Not on file   Social Determinants of Health   Financial Resource Strain: Not on file  Food Insecurity: Not on file  Transportation Needs: Not on file  Physical Activity: Not on file  Stress: Not on file  Social Connections: Not on file  Intimate Partner Violence: Not on file    ALLERGIES: Patient has no known allergies.  MEDICATIONS:  Current Outpatient  Medications  Medication Sig Dispense Refill  . aspirin 81 MG chewable tablet Chew 81 mg by mouth daily.    . indomethacin (INDOCIN) 50 MG capsule TAKE (1) CAPSULE BY MOUTH THREE TIMES DAILY AS NEEDED.    Marland Kitchen lisinopril (ZESTRIL) 10 MG tablet Take 1 tablet by mouth daily.    Marland Kitchen lovastatin (MEVACOR) 20 MG tablet Take 20 mg by mouth daily.     No current facility-administered medications for this encounter.    REVIEW OF SYSTEMS:  On review of systems, the patient reports that he is doing well overall. He denies any chest pain, shortness of breath, cough, fevers, chills, night sweats, unintended weight changes. He denies any bowel disturbances, and denies  abdominal pain, nausea or vomiting. He denies any new musculoskeletal or joint aches or pains. His IPSS was 5, indicating mild urinary symptoms. His chief complaint is urgency. His SHIM was 23, indicating he does not have erectile dysfunction. A complete review of systems is obtained and is otherwise negative.    PHYSICAL EXAM:  Wt Readings from Last 3 Encounters:  02/15/21 185 lb (83.9 kg)  02/11/21 190 lb (86.2 kg)  01/16/21 190 lb (86.2 kg)   Temp Readings from Last 3 Encounters:  02/11/21 98.5 F (36.9 C)  01/30/21 98 F (36.7 C) (Oral)  01/16/21 98.8 F (37.1 C)   BP Readings from Last 3 Encounters:  02/11/21 (!) 154/71  01/30/21 130/70  01/16/21 (!) 154/77   Pulse Readings from Last 3 Encounters:  02/11/21 89  01/30/21 70  01/16/21 95   Pain Assessment Pain Score: 0-No pain/10  Physical exam not performed in light of telephone consult visit format.  KPS = 100  100 - Normal; no complaints; no evidence of disease. 90   - Able to carry on normal activity; minor signs or symptoms of disease. 80   - Normal activity with effort; some signs or symptoms of disease. 72   - Cares for self; unable to carry on normal activity or to do active work. 60   - Requires occasional assistance, but is able to care for most of his personal needs. 50   - Requires considerable assistance and frequent medical care. 75   - Disabled; requires special care and assistance. 69   - Severely disabled; hospital admission is indicated although death not imminent. 95   - Very sick; hospital admission necessary; active supportive treatment necessary. 10   - Moribund; fatal processes progressing rapidly. 0     - Dead  Karnofsky DA, Abelmann Raysal, Craver LS and Burchenal [facility] [phone]) The use of the nitrogen mustards in the palliative treatment of carcinoma: with particular reference to bronchogenic carcinoma Cancer 1 [id number]  LABORATORY DATA:  Lab Results  Component Value Date   HGB 14.9 07/27/2009    HCT 41.5 07/27/2009   Lab Results  Component Value Date   NA 142 02/11/2021   K 4.3 02/11/2021   CL 102 02/11/2021   CO2 21 02/11/2021   No results found for: ALT, AST, GGT, ALKPHOS, BILITOT   RADIOGRAPHY: No results found.    IMPRESSION/PLAN: This visit was conducted via Telephone to spare the patient unnecessary potential exposure in the healthcare setting during the current COVID-19 pandemic. 1. 65 y.o. gentleman with Stage T1c adenocarcinoma of the prostate with Gleason Score of 4+5, and PSA of 9.8. We discussed the patient's workup and outlined the nature of prostate cancer in this setting. The patient's T stage, Gleason's score, and PSA put him into  the high risk group. Accordingly, he is eligible for a variety of potential treatment options including prostatectomy or LT-ADT in combination with either 8 weeks of external radiation or 5 weeks of external radiation preceded by a brachytherapy boost. We discussed the available radiation techniques, and focused on the details and logistics of delivery. We discussed and outlined the risks, benefits, short and long-term effects associated with radiotherapy and compared and contrasted these with prostatectomy. We discussed the role of SpaceOAR in reducing the rectal toxicity associated with radiotherapy. We also detailed the role of ADT in the treatment of high risk prostate cancer and outlined the associated side effects that could be expected with this therapy. We discussed that he still needs to complete his staging scans before we make our final treatment recommendation.  He was encouraged to ask questions that were answered to his stated satisfaction.  At the end of our conversation, pending his staging scans confirm localized prostate cancer, the patient is interested in proceeding with long-term ADT in combination with an upfront brachytherapy boost followed by a 5 week course of daily external beam radiotherapy. He is also interested in  having SpaceOAR gel placement to reduce the rectal toxicities of radiotherapy and although he lives in [place], [place], he prefers to come to [place] for his daily radiation treatments. Therefore, he will proceed as scheduled with CT A/P on 03/15/2021 and anticipate that his bone scan will be performed around that same time as well.  Since ADT will be part of his treatment regardless, we will move forward with coordinating a follow-up appointment with Dr. Alyson Ingles to initiate ADT, first available.  Pending his staging scans show localized disease only, we will help to coordinate the brachytherapy boost and SpaceOAR gel insertion, approximately 8 weeks from the start of ADT. We will plan to see him back in the office approximately 2 weeks following his seed boost procedure and will proceed with CT simulation in preparation for beginning 5 weeks of prostate external beam radiotherapy shortly thereafter. He appears to have a good understanding of his disease and our treatment recommendations which are of curative intent.  He understands that our recommendations could potentially change pending the results of his staging studies and he is comfortable with and in agreement with the stated plan.  We enjoyed meeting him today and look forward to participating in the care of this nice gentleman. We will share our findings with Dr. Alyson Ingles and move forward with coordinating his treatment.  Given current concerns for patient exposure during the COVID-19 pandemic, this encounter was conducted via telephone. The patient was notified in advance and was offered a MyChart meeting to allow for face to face communication but unfortunately reported that he did not have the appropriate resources/technology to support such a visit and instead preferred to proceed with telephone consult. The patient has given verbal consent for this type of encounter. The time spent during this encounter was 60 minutes. The attendants for this  meeting include Tyler Pita MD, Ashlyn Bruning PA-C, Bricelyn, and patient HUEL CENTOLA  During the encounter, Tyler Pita MD, Ashlyn Bruning PA-C, and scribe, Wilburn Mylar were located at [facility] Radiation Oncology Department.  Patient GENE GLAZEBROOK  was located at home.    Nicholos Johns, PA-C    Tyler Pita, MD  [facility] Direct Dial: [phone]  Fax: [phone] [email or web address]  Skype  [email or web address]   This document serves as a record of services  personally performed by Tyler Pita, MD and Freeman Caldron, PA-C. It was created on their behalf by Wilburn Mylar, a trained medical scribe. The creation of this record is based on the scribe's personal observations and the provider's statements to them. This document has been checked and approved by the attending provider.

## 2021-02-18 ENCOUNTER — Telehealth: Payer: Self-pay | Admitting: *Deleted

## 2021-02-18 NOTE — Telephone Encounter (Signed)
CALLED PATIENT TO INFORM OF ADT APPT. WITH DR. Alyson Ingles ON 02-22-21 - ARRIVAL TIME- 10 AM, @ DR. Artist Pais OFFICE, LVM FOR A RETURN CALL

## 2021-02-19 ENCOUNTER — Other Ambulatory Visit: Payer: Self-pay

## 2021-02-22 ENCOUNTER — Encounter: Payer: Self-pay | Admitting: Urology

## 2021-02-22 ENCOUNTER — Ambulatory Visit (INDEPENDENT_AMBULATORY_CARE_PROVIDER_SITE_OTHER): Payer: Medicare Other | Admitting: Urology

## 2021-02-22 ENCOUNTER — Other Ambulatory Visit: Payer: Self-pay

## 2021-02-22 VITALS — BP 138/92 | HR 64 | Temp 98.0°F | Ht 68.0 in | Wt 187.0 lb

## 2021-02-22 DIAGNOSIS — C61 Malignant neoplasm of prostate: Secondary | ICD-10-CM

## 2021-02-22 LAB — URINALYSIS, ROUTINE W REFLEX MICROSCOPIC
Bilirubin, UA: NEGATIVE
Glucose, UA: NEGATIVE
Ketones, UA: NEGATIVE
Leukocytes,UA: NEGATIVE
Nitrite, UA: NEGATIVE
Protein,UA: NEGATIVE
Specific Gravity, UA: 1.02 (ref 1.005–1.030)
Urobilinogen, Ur: 0.2 mg/dL (ref 0.2–1.0)
pH, UA: 5 (ref 5.0–7.5)

## 2021-02-22 MED ORDER — LEUPROLIDE ACETATE (6 MONTH) 45 MG ~~LOC~~ KIT
45.0000 mg | PACK | Freq: Once | SUBCUTANEOUS | Status: AC
  Administered 2021-02-22: 45 mg via SUBCUTANEOUS

## 2021-02-22 NOTE — Progress Notes (Signed)
02/22/2021 11:01 AM   [name] 09-27-1956 [id number]  Referring provider: [facility] [place],  [place] [address]  followup prostate cancer  HPI: [name] is a 65yo here for followup for prostate cancer. He met with Dr. Tammi Baird and has elected to have combination brachytherapy and IMRT. He has mild LUTS. Mild ED. He is here to start 2 years of ADT.    PMH: Past Medical History:  Diagnosis Date  . Hypertension   . Prostate cancer [facility])     Surgical History: Past Surgical History:  Procedure Laterality Date  . COLONOSCOPY N/A 07/22/2013   Procedure: COLONOSCOPY;  Surgeon: Scott So, MD;  Location: [facility];  Service: Gastroenterology;  Laterality: N/A;  . PROSTATE BIOPSY      Home Medications:  Allergies as of 02/22/2021   No Known Allergies     Medication List       Accurate as of February 22, 2021 11:01 AM. If you have any questions, ask your nurse or doctor.        aspirin 81 MG chewable tablet Chew 81 mg by mouth daily.   indomethacin 50 MG capsule Commonly known as: INDOCIN TAKE (1) CAPSULE BY MOUTH THREE TIMES DAILY AS NEEDED.   lisinopril 10 MG tablet Commonly known as: ZESTRIL Take 1 tablet by mouth daily.   lovastatin 20 MG tablet Commonly known as: MEVACOR Take 20 mg by mouth daily.       Allergies: No Known Allergies  Family History: Family History  Problem Relation Age of Onset  . Breast cancer Sister   . Cervical cancer Sister   . Prostate cancer Neg Hx   . Colon cancer Neg Hx   . Pancreatic cancer Neg Hx     Social History:  reports that he has never smoked. He has never used smokeless tobacco. He reports that he does not drink alcohol and does not use drugs.  ROS: All other review of systems were reviewed and are negative except what is noted above in HPI  Physical Exam: BP (!) 138/92   Pulse 64   Temp 98 F (36.7 C)   Ht 5' 8"  (1.727 m)   Wt 187 lb (84.8 kg)   BMI  28.43 kg/m   Constitutional:  Alert and oriented, No acute distress. HEENT: [place] AT, moist mucus membranes.  Trachea midline, no masses. Cardiovascular: No clubbing, cyanosis, or edema. Respiratory: Normal respiratory effort, no increased work of breathing. GI: Abdomen is soft, nontender, nondistended, no abdominal masses GU: No CVA tenderness.  Lymph: No cervical or inguinal lymphadenopathy. Skin: No rashes, bruises or suspicious lesions. Neurologic: Grossly intact, no focal deficits, moving all 4 extremities. Psychiatric: Normal mood and affect.  Laboratory Data: Lab Results  Component Value Date   HGB 14.9 07/27/2009   HCT 41.5 07/27/2009    Lab Results  Component Value Date   CREATININE 1.29 (H) 02/11/2021    No results found for: PSA  No results found for: TESTOSTERONE  No results found for: HGBA1C  Urinalysis    Component Value Date/Time   APPEARANCEUR Clear 01/16/2021 1612   GLUCOSEU Negative 01/16/2021 1612   BILIRUBINUR Negative 01/16/2021 1612   PROTEINUR Trace (A) 01/16/2021 1612   NITRITE Negative 01/16/2021 1612   LEUKOCYTESUR Negative 01/16/2021 1612    Lab Results  Component Value Date   LABMICR See below: 01/16/2021   WBCUA None seen 01/16/2021   LABEPIT None seen 01/16/2021   MUCUS  Present 01/16/2021   BACTERIA None seen 01/16/2021    Pertinent Imaging:  Results for orders placed during the hospital encounter of 07/24/09  DG Abd 1 View  Narrative Clinical Data: Left ureteral calculus  ABDOMEN - 1 VIEW  Comparison: CT on 06/07/2009  Findings: Two adjacent calculi are now seen in the lower left pelvis in the expected region of the ureterovesicle junction. These measure 3-5 mm in size and have passed distally from the left intrarenal collecting system and UPJ region since prior CT.  IMPRESSION: Two small distal left ureteral calculi, which showed distal migration compared to prior CT.  Provider: Briscoe Baird  No results found  for this or any previous visit.  No results found for this or any previous visit.  No results found for this or any previous visit.  No results found for this or any previous visit.  No results found for this or any previous visit.  No results found for this or any previous visit.  No results found for this or any previous visit.   Assessment & Plan:    1. Prostate cancer ([place]) -Eligard 38m today -We will schedule brachytherapy pending CT and bone scan.  - Urinalysis, Routine w reflex microscopic   No follow-ups on file.  PNicolette Bang MD  [facility]

## 2021-02-22 NOTE — Progress Notes (Signed)
Eligard SubQ Injection   Due to Prostate Cancer patient is present today for a Eligard Injection.  Medication: Eligard 6 month Dose: 45 mg  Location: right  Lot: [id number]  Exp: 06/2022  Patient tolerated well, no complications were noted  Performed by: Elie Gragert, lpn    Urological Symptom Review  Patient is experiencing the following symptoms: Kidney stones   Review of Systems  Gastrointestinal (upper)  : Negative for upper GI symptoms  Gastrointestinal (lower) : Negative for lower GI symptoms  Constitutional : Negative for symptoms  Skin: Negative for skin symptoms  Eyes: Negative for eye symptoms  Ear/Nose/Throat : Negative for Ear/Nose/Throat symptoms  Hematologic/Lymphatic: Negative for Hematologic/Lymphatic symptoms  Cardiovascular : Negative for cardiovascular symptoms  Respiratory : Negative for respiratory symptoms  Endocrine: Negative for endocrine symptoms  Musculoskeletal: Negative for musculoskeletal symptoms  Neurological: Negative for neurological symptoms  Psychologic: Negative for psychiatric symptoms

## 2021-02-22 NOTE — Patient Instructions (Signed)
Prostate Cancer  The prostate is a male gland that helps make semen. It is located below a man's bladder, in front of the rectum. Prostate cancer is when abnormal cells grow in this gland. What are the causes? The cause of this condition is not known. What increases the risk? You are more likely to develop this condition if:  You are 65 years of age or older.  You are African American.  You have a family history of prostate cancer.  You have a family history of breast cancer. What are the signs or symptoms? Symptoms of this condition include:  A need to pee often.  Peeing that is weak, or pee that stops and starts.  Trouble starting or stopping your pee.  Inability to pee.  Blood in your pee or semen.  Pain in the lower back, lower belly (abdomen), hips, or upper thighs.  Trouble getting an erection.  Trouble emptying all of your pee. How is this treated? Treatment for this condition depends on your age, your health, the kind of treatment you like, and how far the cancer has spread. Treatments include:  Being watched. This is called observation. You will be tested from time to time, but you will not get treated. Tests are to make sure that the cancer is not growing.  Surgery. This may be done to remove the prostate, to remove the testicles, or to freeze or kill cancer cells.  Radiation. This uses a strong beam to kill cancer cells.  Ultrasound energy. This uses strong sound waves to kill cancer cells.  Chemotherapy. This uses medicines that stop cancer cells from increasing. This kills cancer cells and healthy cells.  Targeted therapy. This kills cancer cells only. Healthy cells are not affected.  Hormone treatment. This stops the body from making hormones that help the cancer cells to grow. Follow these instructions at home:  Take over-the-counter and prescription medicines only as told by your doctor.  Eat a healthy diet.  Get plenty of sleep.  Ask your  doctor for help to find a support group for men with prostate cancer.  If you have to go to the hospital, let your cancer doctor (oncologist) know.  Treatment may affect your ability to have sex. Touch, hold, hug, and caress your partner to have intimate moments.  Keep all follow-up visits as told by your doctor. This is important. Contact a doctor if:  You have new or more trouble peeing.  You have new or more blood in your pee.  You have new or more pain in your hips, back, or chest. Get help right away if:  You have weakness in your legs.  You lose feeling in your legs.  You cannot control your pee or your poop (stool).  You have chills or a fever. Summary  The prostate is a male gland that helps make semen.  Prostate cancer is when abnormal cells grow in this gland.  Treatment includes doing surgery, using medicines, using very strong beams, or watching without treatment.  Ask your doctor for help to find a support group for men with prostate cancer.  Contact a doctor if you have problems peeing or have any new pain that you did not have before. This information is not intended to replace advice given to you by your health care provider. Make sure you discuss any questions you have with your health care provider. Document Revised: 11/29/2019 Document Reviewed: 11/29/2019 Elsevier Patient Education  2021 Elsevier Inc.  

## 2021-02-25 ENCOUNTER — Telehealth: Payer: Self-pay

## 2021-02-25 NOTE — Telephone Encounter (Signed)
Patient called and notified of surgery date/time and location.

## 2021-02-26 ENCOUNTER — Telehealth: Payer: Self-pay | Admitting: *Deleted

## 2021-02-26 NOTE — Telephone Encounter (Signed)
CALLED PATIENT TO INFORM OF PRE-SEED APPTS. FOR 03-28-21 AND HIS IMPLANT ON 04-25-21, SPOKE WITH PATIENT AND HE IS AWARE OF THESE APPTS.

## 2021-03-15 ENCOUNTER — Other Ambulatory Visit: Payer: Self-pay

## 2021-03-15 ENCOUNTER — Ambulatory Visit (HOSPITAL_COMMUNITY)
Admission: RE | Admit: 2021-03-15 | Discharge: 2021-03-15 | Disposition: A | Discharge status: Home / Self Care | Payer: Medicare Other | Source: Ambulatory Visit | Attending: Urology | Admitting: Urology

## 2021-03-15 ENCOUNTER — Encounter (HOSPITAL_COMMUNITY): Payer: Self-pay

## 2021-03-15 DIAGNOSIS — C61 Malignant neoplasm of prostate: Secondary | ICD-10-CM | POA: Insufficient documentation

## 2021-03-15 MED ORDER — TECHNETIUM TC 99M MEDRONATE IV KIT
20.0000 | PACK | Freq: Once | INTRAVENOUS | Status: AC | PRN
  Administered 2021-03-15: 20.7 via INTRAVENOUS

## 2021-03-20 ENCOUNTER — Ambulatory Visit: Payer: Medicare Other | Admitting: Urology

## 2021-03-26 NOTE — Progress Notes (Signed)
Sent via mychart

## 2021-03-27 ENCOUNTER — Telehealth: Payer: Self-pay | Admitting: *Deleted

## 2021-03-27 NOTE — Telephone Encounter (Signed)
CALLED PATIENT'S WIFE (PAM) TO REMIND OF PRE-SEED APPTS. FOR 03-28-21, LVM FOR A RETURN CALL

## 2021-03-27 NOTE — Progress Notes (Signed)
  Radiation Oncology         [phone] ________________________________  Name: [name] MRN: [id number]  Date: 03/28/2021  DOB: [birth year]  SIMULATION AND TREATMENT PLANNING NOTE PUBIC ARCH STUDY  CC:[facility]  McKenzie, Candee Furbish, MD  DIAGNOSIS: 65 y.o. gentleman with Stage T1c adenocarcinoma of the prostate with Gleason score of 4+5, and PSA of 9.8.  Oncology History  Prostate cancer ([place])  01/30/2021 Cancer Staging   Staging form: Prostate, AJCC 8th Edition - Clinical stage from 01/30/2021: Stage IIIC (cT1c, cN0, cM0, PSA: 9.8, Grade Group: 5) - Signed by Freeman Caldron, PA-C on 02/18/2021 Histopathologic type: Adenocarcinoma, NOS Stage prefix: Initial diagnosis Prostate specific antigen (PSA) range: Less than 10 Gleason primary pattern: 4 Gleason secondary pattern: 5 Gleason score: 9 Histologic grading system: 5 grade system Number of biopsy cores examined: 12 Number of biopsy cores positive: 6 Location of positive needle core biopsies: Both sides   02/11/2021 Initial Diagnosis   Prostate cancer ([place])       ICD-10-CM   1. Prostate cancer ([place])  C61     COMPLEX SIMULATION:  The patient presented today for evaluation for possible prostate seed implant. He was brought to the radiation planning suite and placed supine on the CT couch. A 3-dimensional image study set was obtained in upload to the planning computer. There, on each axial slice, I contoured the prostate gland. Then, using three-dimensional radiation planning tools I reconstructed the prostate in view of the structures from the transperineal needle pathway to assess for possible pubic arch interference. In doing so, I did not appreciate any pubic arch interference. Also, the patient's prostate volume was estimated based on the drawn structure. The volume was 32 cc.  Given the pubic arch appearance and prostate volume, patient remains a good candidate to proceed with prostate seed  implant. Today, he freely provided informed written consent to proceed.    PLAN: The patient will undergo prostate seed implant.   ________________________________  Sheral Apley. Tammi Klippel, M.D.

## 2021-03-28 ENCOUNTER — Encounter (HOSPITAL_COMMUNITY)
Admission: RE | Admit: 2021-03-28 | Discharge: 2021-03-28 | Disposition: A | Discharge status: Home / Self Care | Payer: Medicare Other | Source: Ambulatory Visit | Attending: Urology | Admitting: Urology

## 2021-03-28 ENCOUNTER — Encounter: Payer: Self-pay | Admitting: Medical Oncology

## 2021-03-28 ENCOUNTER — Ambulatory Visit (HOSPITAL_COMMUNITY)
Admission: RE | Admit: 2021-03-28 | Discharge: 2021-03-28 | Disposition: A | Discharge status: Home / Self Care | Payer: Medicare Other | Source: Ambulatory Visit | Attending: Urology | Admitting: Urology

## 2021-03-28 ENCOUNTER — Other Ambulatory Visit: Payer: Self-pay

## 2021-03-28 ENCOUNTER — Ambulatory Visit
Admission: RE | Admit: 2021-03-28 | Discharge: 2021-03-28 | Disposition: A | Discharge status: Home / Self Care | Payer: Medicare Other | Source: Ambulatory Visit | Attending: Radiation Oncology | Admitting: Radiation Oncology

## 2021-03-28 ENCOUNTER — Ambulatory Visit
Admission: RE | Admit: 2021-03-28 | Discharge: 2021-03-28 | Disposition: A | Discharge status: Home / Self Care | Payer: Medicare Other | Source: Ambulatory Visit | Attending: Urology | Admitting: Urology

## 2021-03-28 ENCOUNTER — Other Ambulatory Visit (HOSPITAL_COMMUNITY): Payer: Medicare Other

## 2021-03-28 DIAGNOSIS — Z01818 Encounter for other preprocedural examination: Secondary | ICD-10-CM

## 2021-03-28 DIAGNOSIS — C61 Malignant neoplasm of prostate: Secondary | ICD-10-CM | POA: Diagnosis not present

## 2021-04-01 ENCOUNTER — Encounter: Payer: Self-pay | Admitting: Urology

## 2021-04-16 MED ORDER — MEGESTROL ACETATE 20 MG PO TABS
20.0000 mg | ORAL_TABLET | Freq: Two times a day (BID) | ORAL | 2 refills | Status: DC | PRN
Start: 2021-04-16 — End: 2021-09-04

## 2021-04-17 ENCOUNTER — Telehealth: Payer: Self-pay

## 2021-04-17 NOTE — Telephone Encounter (Signed)
Patient called office to confirm if he should continue his Aspirin. Dr. Alyson Ingles has approved patient to continue his aspirin. Pt voiced understanding.

## 2021-04-18 ENCOUNTER — Telehealth: Payer: Self-pay | Admitting: *Deleted

## 2021-04-18 NOTE — Telephone Encounter (Signed)
CALLED PATIENT TO INFORM OF LABS AND COVID TESTING FOR 04-24-21, SPOKE WITH PATIENT AND HE IS AWARE OF THESE APPTS.

## 2021-04-22 ENCOUNTER — Other Ambulatory Visit: Payer: Self-pay

## 2021-04-22 ENCOUNTER — Encounter (HOSPITAL_BASED_OUTPATIENT_CLINIC_OR_DEPARTMENT_OTHER): Payer: Self-pay | Admitting: Urology

## 2021-04-22 ENCOUNTER — Other Ambulatory Visit (HOSPITAL_COMMUNITY): Payer: Medicare Other

## 2021-04-22 NOTE — Progress Notes (Signed)
Spoke w/ via phone for pre-op interview--- PT Lab needs dos---- no              Lab results------ pt getting CBC, CMP, PT/ PTT 04-24-2021 @ 0845/  Current ekg/ cxr in epic / chart COVID test ------ 04-24-2021 @ 1000 Arrive at ------- 1100 on 04-25-2021 NPO after MN NO Solid Food.  Clear liquids from MN until--- 1000 Med rec completed Medications to take morning of surgery ----- Lovastatin Diabetic medication ----- n/a Patient instructed to bring photo id and insurance card day of surgery Patient aware to have Driver (ride ) / caregiver    for 24 hours after surgery -- wife, [name] Patient Special Instructions ----- to do one fleet enema morning of surgery Pre-Op special Istructions ----- n/a Patient verbalized understanding of instructions that were given at this phone interview. Patient denies shortness of breath, chest pain, fever, cough at this phone interview.

## 2021-04-24 ENCOUNTER — Encounter (HOSPITAL_COMMUNITY)
Admission: RE | Admit: 2021-04-24 | Discharge: 2021-04-24 | Disposition: A | Discharge status: Home / Self Care | Payer: Medicare Other | Source: Ambulatory Visit | Attending: Urology | Admitting: Urology

## 2021-04-24 ENCOUNTER — Other Ambulatory Visit: Payer: Self-pay | Admitting: Urology

## 2021-04-24 ENCOUNTER — Other Ambulatory Visit (HOSPITAL_COMMUNITY)
Admission: RE | Admit: 2021-04-24 | Discharge: 2021-04-24 | Disposition: A | Discharge status: Home / Self Care | Payer: Medicare Other | Source: Ambulatory Visit | Attending: Urology | Admitting: Urology

## 2021-04-24 ENCOUNTER — Telehealth: Payer: Self-pay | Admitting: *Deleted

## 2021-04-24 ENCOUNTER — Telehealth: Payer: Self-pay

## 2021-04-24 ENCOUNTER — Encounter: Payer: Self-pay | Admitting: Urology

## 2021-04-24 ENCOUNTER — Other Ambulatory Visit: Payer: Self-pay

## 2021-04-24 DIAGNOSIS — Z20822 Contact with and (suspected) exposure to covid-19: Secondary | ICD-10-CM | POA: Insufficient documentation

## 2021-04-24 DIAGNOSIS — Z01812 Encounter for preprocedural laboratory examination: Secondary | ICD-10-CM | POA: Insufficient documentation

## 2021-04-24 LAB — COMPREHENSIVE METABOLIC PANEL
ALT: 18 U/L (ref 0–44)
AST: 18 U/L (ref 15–41)
Albumin: 4.1 g/dL (ref 3.5–5.0)
Alkaline Phosphatase: 76 U/L (ref 38–126)
Anion gap: 8 (ref 5–15)
BUN: 25 mg/dL — ABNORMAL HIGH (ref 8–23)
CO2: 24 mmol/L (ref 22–32)
Calcium: 9.3 mg/dL (ref 8.9–10.3)
Chloride: 107 mmol/L (ref 98–111)
Creatinine, Ser: 1.24 mg/dL (ref 0.61–1.24)
GFR, Estimated: >60 mL/min (ref >60)
Glucose, Bld: 181 mg/dL — ABNORMAL HIGH (ref 70–99)
Potassium: 4.7 mmol/L (ref 3.5–5.1)
Sodium: 139 mmol/L (ref 135–145)
Total Bilirubin: 0.6 mg/dL (ref 0.3–1.2)
Total Protein: 7 g/dL (ref 6.5–8.1)

## 2021-04-24 LAB — PROTIME-INR
INR: 1 (ref 0.8–1.2)
Prothrombin Time: 13.3 seconds (ref 11.4–15.2)

## 2021-04-24 LAB — CBC
HCT: 50 % (ref 39.0–52.0)
Hemoglobin: 16.8 g/dL (ref 13.0–17.0)
MCH: 30.3 pg (ref 26.0–34.0)
MCHC: 33.6 g/dL (ref 30.0–36.0)
MCV: 90.3 fL (ref 80.0–100.0)
Platelets: 284 10*3/uL (ref 150–400)
RBC: 5.54 MIL/uL (ref 4.22–5.81)
RDW: 13 % (ref 11.5–15.5)
WBC: 7 10*3/uL (ref 4.0–10.5)
nRBC: 0 % (ref 0.0–0.2)

## 2021-04-24 LAB — SARS CORONAVIRUS 2 (TAT 6-24 HRS): SARS Coronavirus 2: NEGATIVE

## 2021-04-24 LAB — APTT: aPTT: 31 seconds (ref 24–36)

## 2021-04-24 NOTE — Telephone Encounter (Signed)
CALLED PATIENT TO REMIND OF PROCEDURE FOR 04-25-21, SPOKE WITH PATIENT AND HE IS AWARE OF THIS PROCEDURE

## 2021-04-24 NOTE — Telephone Encounter (Signed)
Patient needing a call back regarding a code for pt's insurance to approve labs done recently.  Call back:  [phone]   Thanks, Helene Kelp

## 2021-04-24 NOTE — Telephone Encounter (Signed)
I returned patients call. Patient stated he remembered after calling the lab statement from [place] was from October 2021 from his PCP office and would call them to have his lab issues fixed.

## 2021-04-25 ENCOUNTER — Ambulatory Visit (HOSPITAL_BASED_OUTPATIENT_CLINIC_OR_DEPARTMENT_OTHER): Payer: Medicare Other | Admitting: Anesthesiology

## 2021-04-25 ENCOUNTER — Encounter (HOSPITAL_BASED_OUTPATIENT_CLINIC_OR_DEPARTMENT_OTHER)
Admission: RE | Disposition: A | Discharge status: Home / Self Care | Payer: Self-pay | Source: Home / Self Care | Attending: Urology

## 2021-04-25 ENCOUNTER — Ambulatory Visit (HOSPITAL_BASED_OUTPATIENT_CLINIC_OR_DEPARTMENT_OTHER)
Admission: RE | Admit: 2021-04-25 | Discharge: 2021-04-25 | Disposition: A | Discharge status: Home / Self Care | Payer: Medicare Other | Attending: Urology | Admitting: Urology

## 2021-04-25 ENCOUNTER — Ambulatory Visit (HOSPITAL_COMMUNITY): Payer: Medicare Other

## 2021-04-25 ENCOUNTER — Other Ambulatory Visit: Payer: Self-pay

## 2021-04-25 ENCOUNTER — Encounter (HOSPITAL_BASED_OUTPATIENT_CLINIC_OR_DEPARTMENT_OTHER): Payer: Self-pay | Admitting: Urology

## 2021-04-25 DIAGNOSIS — Z8049 Family history of malignant neoplasm of other genital organs: Secondary | ICD-10-CM | POA: Insufficient documentation

## 2021-04-25 DIAGNOSIS — Z803 Family history of malignant neoplasm of breast: Secondary | ICD-10-CM | POA: Insufficient documentation

## 2021-04-25 DIAGNOSIS — C61 Malignant neoplasm of prostate: Secondary | ICD-10-CM | POA: Diagnosis not present

## 2021-04-25 HISTORY — DX: Nocturia: R35.1

## 2021-04-25 HISTORY — DX: Gastro-esophageal reflux disease without esophagitis: K21.9

## 2021-04-25 HISTORY — DX: Personal history of other diseases of the musculoskeletal system and connective tissue: Z87.39

## 2021-04-25 HISTORY — PX: SPACE OAR INSTILLATION: SHX6769

## 2021-04-25 HISTORY — PX: CYSTOSCOPY: SHX5120

## 2021-04-25 HISTORY — PX: RADIOACTIVE SEED IMPLANT: SHX5150

## 2021-04-25 SURGERY — INSERTION, RADIATION SOURCE, PROSTATE
Anesthesia: General | Site: Rectum

## 2021-04-25 MED ORDER — CEFAZOLIN SODIUM-DEXTROSE 2-4 GM/100ML-% IV SOLN
2.0000 g | Freq: Once | INTRAVENOUS | Status: AC
  Administered 2021-04-25: 2 g via INTRAVENOUS

## 2021-04-25 MED ORDER — TAMSULOSIN HCL 0.4 MG PO CAPS: 0.4000 mg | ORAL_CAPSULE | Freq: Every day | ORAL | 11 refills | Status: DC | PRN

## 2021-04-25 MED ORDER — ONDANSETRON HCL 4 MG/2ML IJ SOLN
INTRAMUSCULAR | Status: DC | PRN
  Administered 2021-04-25: 4 mg via INTRAVENOUS

## 2021-04-25 MED ORDER — FLEET ENEMA 7-19 GM/118ML RE ENEM: 1.0000 | ENEMA | Freq: Once | RECTAL | Status: DC

## 2021-04-25 MED ORDER — FENTANYL CITRATE (PF) 100 MCG/2ML IJ SOLN
INTRAMUSCULAR | Status: AC
  Filled 2021-04-25: qty 2

## 2021-04-25 MED ORDER — ONDANSETRON HCL 4 MG/2ML IJ SOLN: 4.0000 mg | Freq: Once | INTRAMUSCULAR | Status: DC | PRN

## 2021-04-25 MED ORDER — IOHEXOL 300 MG/ML  SOLN
INTRAMUSCULAR | Status: DC | PRN
  Administered 2021-04-25: 7 mL

## 2021-04-25 MED ORDER — TRAMADOL HCL 50 MG PO TABS
ORAL_TABLET | ORAL | Status: AC
  Filled 2021-04-25: qty 1

## 2021-04-25 MED ORDER — ONDANSETRON HCL 4 MG/2ML IJ SOLN
INTRAMUSCULAR | Status: AC
  Filled 2021-04-25: qty 2

## 2021-04-25 MED ORDER — FENTANYL CITRATE (PF) 100 MCG/2ML IJ SOLN: 25.0000 ug | INTRAMUSCULAR | Status: DC | PRN

## 2021-04-25 MED ORDER — TRAMADOL HCL 50 MG PO TABS
50.0000 mg | ORAL_TABLET | Freq: Four times a day (QID) | ORAL | Status: DC | PRN
  Administered 2021-04-25: 50 mg via ORAL

## 2021-04-25 MED ORDER — DEXAMETHASONE SODIUM PHOSPHATE 4 MG/ML IJ SOLN
INTRAMUSCULAR | Status: DC | PRN
  Administered 2021-04-25: 6 mg via INTRAVENOUS

## 2021-04-25 MED ORDER — SODIUM CHLORIDE 0.9 % IR SOLN
Status: DC | PRN
  Administered 2021-04-25: 1000 mL via INTRAVESICAL

## 2021-04-25 MED ORDER — OXYCODONE HCL 5 MG PO TABS
5.0000 mg | ORAL_TABLET | Freq: Once | ORAL | Status: DC | PRN
Start: 2021-04-25 — End: 2021-04-25

## 2021-04-25 MED ORDER — CEFAZOLIN SODIUM-DEXTROSE 2-4 GM/100ML-% IV SOLN
INTRAVENOUS | Status: AC
  Filled 2021-04-25: qty 100

## 2021-04-25 MED ORDER — PROPOFOL 10 MG/ML IV BOLUS
INTRAVENOUS | Status: DC | PRN
  Administered 2021-04-25: 30 mg via INTRAVENOUS
  Administered 2021-04-25: 200 mg via INTRAVENOUS

## 2021-04-25 MED ORDER — DEXAMETHASONE SODIUM PHOSPHATE 10 MG/ML IJ SOLN
INTRAMUSCULAR | Status: AC
  Filled 2021-04-25: qty 1

## 2021-04-25 MED ORDER — MIDAZOLAM HCL 5 MG/5ML IJ SOLN
INTRAMUSCULAR | Status: DC | PRN
  Administered 2021-04-25: 2 mg via INTRAVENOUS

## 2021-04-25 MED ORDER — LACTATED RINGERS IV SOLN: INTRAVENOUS | Status: DC

## 2021-04-25 MED ORDER — LIDOCAINE 2% (20 MG/ML) 5 ML SYRINGE
INTRAMUSCULAR | Status: DC | PRN
  Administered 2021-04-25: 60 mg via INTRAVENOUS

## 2021-04-25 MED ORDER — SODIUM CHLORIDE (PF) 0.9 % IJ SOLN
INTRAMUSCULAR | Status: DC | PRN
  Administered 2021-04-25: 10 mL

## 2021-04-25 MED ORDER — MIDAZOLAM HCL 2 MG/2ML IJ SOLN
INTRAMUSCULAR | Status: AC
  Filled 2021-04-25: qty 2

## 2021-04-25 MED ORDER — LIDOCAINE 2% (20 MG/ML) 5 ML SYRINGE
INTRAMUSCULAR | Status: AC
  Filled 2021-04-25: qty 5

## 2021-04-25 MED ORDER — OXYCODONE HCL 5 MG/5ML PO SOLN
5.0000 mg | Freq: Once | ORAL | Status: DC | PRN
Start: 2021-04-25 — End: 2021-04-25

## 2021-04-25 MED ORDER — TRAMADOL HCL 50 MG PO TABS: 50.0000 mg | ORAL_TABLET | Freq: Four times a day (QID) | ORAL | 0 refills | Status: AC | PRN

## 2021-04-25 MED ORDER — FENTANYL CITRATE (PF) 100 MCG/2ML IJ SOLN
INTRAMUSCULAR | Status: DC | PRN
  Administered 2021-04-25: 25 ug via INTRAVENOUS
  Administered 2021-04-25: 100 ug via INTRAVENOUS
  Administered 2021-04-25 (×3): 25 ug via INTRAVENOUS

## 2021-04-25 SURGICAL SUPPLY — 39 items
BAG DRN RND TRDRP ANRFLXCHMBR (UROLOGICAL SUPPLIES) ×3
BAG URINE DRAIN 2000ML AR STRL (UROLOGICAL SUPPLIES) ×4 IMPLANT
BLADE CLIPPER SENSICLIP SURGIC (BLADE) ×4 IMPLANT
CATH FOLEY 2WAY SLVR  5CC 16FR (CATHETERS) ×1
CATH FOLEY 2WAY SLVR 5CC 16FR (CATHETERS) ×3 IMPLANT
CATH ROBINSON RED A/P 16FR (CATHETERS) IMPLANT
CATH ROBINSON RED A/P 20FR (CATHETERS) ×4 IMPLANT
CLOTH BEACON ORANGE TIMEOUT ST (SAFETY) ×4 IMPLANT
CNTNR URN SCR LID CUP LEK RST (MISCELLANEOUS) ×6 IMPLANT
CONT SPEC 4OZ STRL OR WHT (MISCELLANEOUS) ×8
COVER BACK TABLE 60X90IN (DRAPES) ×4 IMPLANT
COVER MAYO STAND STRL (DRAPES) ×4 IMPLANT
DRAPE C-ARM 35X43 STRL (DRAPES) ×4 IMPLANT
DRSG TEGADERM 4X4.75 (GAUZE/BANDAGES/DRESSINGS) ×4 IMPLANT
DRSG TEGADERM 8X12 (GAUZE/BANDAGES/DRESSINGS) ×4 IMPLANT
GAUZE SPONGE 4X4 12PLY STRL (GAUZE/BANDAGES/DRESSINGS) ×4 IMPLANT
GLOVE SURG ENC MOIS LTX SZ6 (GLOVE) IMPLANT
GLOVE SURG ENC MOIS LTX SZ6.5 (GLOVE) ×8 IMPLANT
GLOVE SURG ENC MOIS LTX SZ7.5 (GLOVE) ×4 IMPLANT
GLOVE SURG ENC MOIS LTX SZ8 (GLOVE) ×4 IMPLANT
GLOVE SURG ORTHO LTX SZ8.5 (GLOVE) ×4 IMPLANT
GLOVE SURG POLYISO LF SZ6.5 (GLOVE) IMPLANT
GOWN STRL REUS W/TWL LRG LVL3 (GOWN DISPOSABLE) ×8 IMPLANT
GOWN STRL REUS W/TWL XL LVL3 (GOWN DISPOSABLE) ×8 IMPLANT
HOLDER FOLEY CATH W/STRAP (MISCELLANEOUS) IMPLANT
IMPL SPACEOAR VUE SYSTEM (Spacer) ×3 IMPLANT
IMPLANT SPACEOAR VUE SYSTEM (Spacer) ×4 IMPLANT
IV NS 1000ML (IV SOLUTION) ×4
IV NS 1000ML BAXH (IV SOLUTION) ×3 IMPLANT
KIT TURNOVER CYSTO (KITS) ×4 IMPLANT
MARKER SKIN DUAL TIP RULER LAB (MISCELLANEOUS) ×4 IMPLANT
PACK CYSTO (CUSTOM PROCEDURE TRAY) ×4 IMPLANT
SURGILUBE 2OZ TUBE FLIPTOP (MISCELLANEOUS) ×4 IMPLANT
SUT BONE WAX W31G (SUTURE) IMPLANT
SYR 10ML LL (SYRINGE) ×4 IMPLANT
TOWEL OR 17X26 10 PK STRL BLUE (TOWEL DISPOSABLE) ×4 IMPLANT
UNDERPAD 30X36 HEAVY ABSORB (UNDERPADS AND DIAPERS) ×8 IMPLANT
WATER STERILE IRR 500ML POUR (IV SOLUTION) ×4 IMPLANT
i-seed agx100 ×212 IMPLANT

## 2021-04-25 NOTE — Discharge Instructions (Signed)
1 - You may have urinary urgency (bladder spasms), bloody urine, and pass small tissue or fragments on / off x few days. This is normal.  2 - Call MD or go to ER for fever >102, severe pain / nausea / vomiting not relieved by medications, or acute change in medical status    Post Anesthesia Home Care Instructions  Activity: Get plenty of rest for the remainder of the day. A responsible individual must stay with you for 24 hours following the procedure.  For the next 24 hours, DO NOT: -Drive a car -Paediatric nurse -Drink alcoholic beverages -Take any medication unless instructed by your physician -Make any legal decisions or sign important papers.  Meals: Start with liquid foods such as gelatin or soup. Progress to regular foods as tolerated. Avoid greasy, spicy, heavy foods. If nausea and/or vomiting occur, drink only clear liquids until the nausea and/or vomiting subsides. Call your physician if vomiting continues.  Special Instructions/Symptoms: Your throat may feel dry or sore from the anesthesia or the breathing tube placed in your throat during surgery. If this causes discomfort, gargle with warm salt water. The discomfort should disappear within 24 hours.  Radioactive Seed Implant Home Care Instructions   Activity:    Rest for the remainder of the day.  Do not drive or operate equipment today.  You may resume normal activities in a few days as instructed by your physician, without risk of harmful radiation exposure to those around you, provided you follow the time and distance precautions on the Radiation Oncology Instruction Sheet.   Meals: Drink plenty of lipuids and eat light foods, such as gelatin or soup this evening .  You may return to normal meal plan tomorrow.  Return To Work: You may return to work as instructed by Naval architect.  Special Instruction:   If any seeds are found, use tweezers to pick up seeds and place in a glass container of any kind and bring to  your physician's office.  Call your physician if any of these symptoms occur:   Persistent or heavy bleeding  Urine stream diminishes or stops completely after catheter is removed  Fever equal to or greater than 101 degrees F  Cloudy urine with a strong foul odor  Severe pain  You may feel some burning pain and/or hesitancy when you urinate after the catheter is removed.  These symptoms may increase over the next few weeks, but should diminish within forur to six weeks.  Applying moist heat to the lower abdomen or a hot tub bath may help relieve the pain.  If the discomfort becomes severe, please call your physician for additional medications.  PROSTATE CANCER TREATMENT WITH RADIOACTIVE IODINE-125 SEED IMPLANT  This instruction sheet is intended to discuss implantation of Iodine-125 seeds as treatment for cancer of the prostate. It will explain in detail what you may expect from this treatment and what precautions are necessary as a result of the treatment. Iodine-125 emits a relatively low energy radiation. The radioactive seeds are surgically implanted directly into the prostate gland. Most of the radiation is contained within the prostate gland. A very small amount is present outside the body.The precautions that we ask you to take are to ensure that those around you are protected from unnecessary radiation. The principles of radiation safety that you need to understand are:  DISTANCE: The further a person is from the radioactive implant the less radiation they will be receiving. The amount of radiation received falls off quite  rapidly with distance. More specific guidelines are given in the table on the last page.  TIME: The amount of radiation a person is exposed to is directly proportional to the amount of time that is spent in close proximity to the radioactive implant. Very little radiation will be received during short periods. See the table on the last page for more specific  guideline.  CHILDREN UNDER AGE 64 Children should not be allowed to sit on your lap or otherwise be in very close contact for more than a few minutes for the first 6-8 weeks following the implant. You may affectionately greet (hug/kiss) a child for a short period of time, but remember, the longer you are in close proximity with that child the more radiation they are being exposed to. At a distance of 6 feet there is no limit to the length of time you may spend together. See specific guidelines on the last page.  PREGNANT OR POSSIBLY PREGNANT WOMEN Pregnant women should avoid prolonged close physical contact with you for the first 6-8 weeks after implant. At a distance of 6 feet there is no limit to the length of time you may spend together. Pregnant women or possibly pregnant women can safely be in close contact with you for a limited period of time. See the last page for guidelines.  FAMILY RELATIONS You may sleep in the same bed as your partner (provided she is not pregnant or under the age of 32). Sexual intercourse, using a condom, may be resumed 2 weeks after the implant. Your semen may be discolored, dark brown or black. This is normal and is the result of bleeding that may have occurred during the implant. After 3-4 weeks it will not be necessary to use a condom.  DAILY ACTIVITIES You may resume normal activities in a few days (example: work, shopping, church) without the risk of harmful radiation exposure to those around you provided you keep in mind the time and distance precautions. Objects that you touch or item that you use do not become radioactive. Linens, clothing, tableware, and dishes may be used by other persons without special precautions. Your bodily wastes (urine and stool) are not radioactive.  SPECIAL PRECAUTIONS It is possible to lose implanted Iodine-125 seed(s) through urination. Although it is possible to pass seeds indefinitely, it is most likely to occur immediately after  catheter removal. To prevent this from happening the catheter that was in place during the implant procedure is removed immediately after the implant and a cystoscopy procedure is performed. The process of removing the catheter and the cystoscopy procedure should dislodge and remove any seeds that are not firmly imbedded in the prostate tissue. However, you should watch for seeds if/when you remove your catheter at home. The seeds are silver colored and the size of a grain of rice. In the unlikely event that a seed is seen after urination, simply flush the seed down the toilet. The seed should not be handled with your fingers, not even with a glove or napkin. A spoon or tweezers can be used to pick up a seed. The Radiation Oncology department is open Monday - Friday from 8:00 am to 5:30 pm with a Radiation Oncologist on call at all times. He or she may be reached by calling [phone]. If you are to be hospitalized or if death should occur, your family should notify the Runner, broadcasting/film/video.  SIDE EFFECTS There are very few side effects associate with the implant procedure. Minor burning with  urination, weak stream, hesitancy, intermittency, frequency, mild pain or feeling unable to pass your urine freely are common and usually stop in one to four months. If these symptoms are extremely uncomfortable, contact your physician.  RADIATION SAFETY GUIDELINES PROSTATE CANCER TREATMENT WITH RADIOACTIVE IODINE-125 SEED IMPLANT  The following guidelines will limit exposure to less than naturally occurring background radiation.  PERSONS AGE 65-45 (if able to become pregnant)  FOR 8 WEEKS FOLLOWING IMPLANT  At a distance of 1 foot: limit time to less than 2 hours/week At a distance of 3 feet: limit time to 20 hours/week At a distance of 6 feet: no restrictions  AFTER 8 WEEKS No restrictions  CHILDREN UNDER AGE 65, PREGNANT WOMEN OR POSSIBLY PREGNANT WOMEN  FOR 8 WEEKS FOLLOWING IMPLANT At a  distance of 1 foot: limit time to 10 minutes/week At a distance of 3 feet: limit time to 2 hours/week At a distance of 6 feet: no restrictions  AFTER 8 WEEKS No restrictions  PERSONS OVER THE AGE OF 45 AND DO NOT EXPECT TO HAVE ANY MORE CHILDREN No restrictions  Updated by SCP in January 2020

## 2021-04-25 NOTE — Anesthesia Procedure Notes (Signed)
Procedure Name: LMA Insertion Date/Time: 04/25/2021 1:19 PM Performed by: Eulas Post, Dan Dissinger W, CRNA Pre-anesthesia Checklist: Patient identified, Emergency Drugs available, Suction available and Patient being monitored Patient Re-evaluated:Patient Re-evaluated prior to induction Oxygen Delivery Method: Circle system utilized Preoxygenation: Pre-oxygenation with 100% oxygen Induction Type: IV induction Ventilation: Mask ventilation without difficulty LMA: LMA inserted LMA Size: 5.0 Number of attempts: 1 Placement Confirmation: positive ETCO2 Tube secured with: Tape Dental Injury: Teeth and Oropharynx as per pre-operative assessment

## 2021-04-25 NOTE — Transfer of Care (Signed)
Immediate Anesthesia Transfer of Care Note  Patient: [name]  Procedure(s) Performed: RADIOACTIVE SEED IMPLANT/BRACHYTHERAPY IMPLANT (N/A Prostate) SPACE OAR INSTILLATION (N/A Rectum) CYSTOSCOPY FLEXIBLE (N/A Bladder)  Patient Location: PACU  Anesthesia Type:General  Level of Consciousness: sedated  Airway & Oxygen Therapy: Patient Spontanous Breathing and Patient connected to face mask oxygen  Post-op Assessment: Report given to RN and Post -op Vital signs reviewed and stable  Post vital signs: Reviewed and stable  Last Vitals:  Vitals Value Taken Time  BP 167/93 04/25/21 1440  Temp    Pulse 87 04/25/21 1442  Resp 16 04/25/21 1442  SpO2 94 % 04/25/21 1442  Vitals shown include unvalidated device data.  Last Pain:  Vitals:   04/25/21 1132  TempSrc: Oral  PainSc: 0-No pain      Patients Stated Pain Goal: 4 (16/10/96 0454)  Complications: No complications documented.

## 2021-04-25 NOTE — Anesthesia Preprocedure Evaluation (Addendum)
Anesthesia Evaluation  Patient identified by MRN, date of birth, ID band Patient awake    Reviewed: Allergy & Precautions, NPO status , Patient's Chart, lab work & pertinent test results  History of Anesthesia Complications Negative for: history of anesthetic complications  Airway Mallampati: III  TM Distance: >3 FB Neck ROM: Full    Dental  (+) Dental Advisory Given, Teeth Intact   Pulmonary neg pulmonary ROS,    Pulmonary exam normal        Cardiovascular hypertension, Pt. on medications Normal cardiovascular exam     Neuro/Psych negative neurological ROS  negative psych ROS   GI/Hepatic Neg liver ROS, GERD  Controlled,  Endo/Other  negative endocrine ROS  Renal/GU negative Renal ROS    Prostate cancer     Musculoskeletal  (+) Arthritis ,   Abdominal   Peds  Hematology negative hematology ROS (+)   Anesthesia Other Findings Covid test negative   Reproductive/Obstetrics                            Anesthesia Physical Anesthesia Plan  ASA: II  Anesthesia Plan: General   Post-op Pain Management:    Induction: Intravenous  PONV Risk Score and Plan: 2 and Treatment may vary due to age or medical condition, Ondansetron and Dexamethasone  Airway Management Planned: LMA  Additional Equipment: None  Intra-op Plan:   Post-operative Plan: Extubation in OR  Informed Consent: I have reviewed the patients History and Physical, chart, labs and discussed the procedure including the risks, benefits and alternatives for the proposed anesthesia with the patient or authorized representative who has indicated his/her understanding and acceptance.     Dental advisory given  Plan Discussed with: CRNA and Anesthesiologist  Anesthesia Plan Comments:        Anesthesia Quick Evaluation

## 2021-04-25 NOTE — Anesthesia Postprocedure Evaluation (Signed)
Anesthesia Post Note  Patient: [name]  Procedure(s) Performed: RADIOACTIVE SEED IMPLANT/BRACHYTHERAPY IMPLANT (N/A Prostate) SPACE OAR INSTILLATION (N/A Rectum) CYSTOSCOPY FLEXIBLE (N/A Bladder)     Patient location during evaluation: PACU Anesthesia Type: General Level of consciousness: awake and alert Pain management: pain level controlled Vital Signs Assessment: post-procedure vital signs reviewed and stable Respiratory status: spontaneous breathing, nonlabored ventilation and respiratory function stable Cardiovascular status: stable and blood pressure returned to baseline Anesthetic complications: no   No complications documented.  Last Vitals:  Vitals:   04/25/21 1515 04/25/21 1530  BP: (!) 143/75 (!) 148/82  Pulse: 80 78  Resp: 15 17  Temp:    SpO2: 100% 100%    Last Pain:  Vitals:   04/25/21 1530  TempSrc:   PainSc: 0-No pain                 Audry Pili

## 2021-04-25 NOTE — Brief Op Note (Signed)
04/25/2021  2:35 PM  PATIENT:  [name]  65 y.o. male  PRE-OPERATIVE DIAGNOSIS:  prostate cancer  POST-OPERATIVE DIAGNOSIS:  prostate cancer  PROCEDURE:  Procedure(s) with comments: RADIOACTIVE SEED IMPLANT/BRACHYTHERAPY IMPLANT (N/A) - 53  SEEDS IMPLANTED SPACE OAR INSTILLATION (N/A) CYSTOSCOPY FLEXIBLE (N/A) - NO SEEDS FOUND IN BLADDER  SURGEON:  Surgeon(s) and Role:    * Alexis Frock, MD - Primary    * Tyler Pita, MD  PHYSICIAN ASSISTANT:   ASSISTANTS: none   ANESTHESIA:   general  EBL:  5 mL   BLOOD ADMINISTERED:none  DRAINS: none   LOCAL MEDICATIONS USED:  NONE  SPECIMEN:  No Specimen  DISPOSITION OF SPECIMEN:  N/A  COUNTS:  YES  TOURNIQUET:  * No tourniquets in log *  DICTATION: .Other Dictation: Dictation Number [id number]  PLAN OF CARE: Discharge to home after PACU  PATIENT DISPOSITION:  PACU - hemodynamically stable.   Delay start of Pharmacological VTE agent (>24hrs) due to surgical blood loss or risk of bleeding: yes

## 2021-04-25 NOTE — H&P (Signed)
[name] is an 65 y.o. male.    Chief Complaint: Pre-OP Prostate Brachytherapy Seen Implantation  HPI:   1 - High Risk Prostate Cancer - 6/12 core up to 50% grade 5 cancer 2022 on eval PSA 9.8. TRUS 26mL, no median. CT and bone scan localized. ON curate path with 2 years androgen deprivation and IMRT + brachytherapy boost radiation.  Today "[name]" is seen to proceed with prostate brachytherapy implantation. Cr 1.2, Hgb 16.8, C19 screen negative.   Past Medical History:  Diagnosis Date  . GERD (gastroesophageal reflux disease)   . History of gout   . Hypertension    followed by pcp   (pt had nuclear study done 07-07-2013 in epic,  normal with no ishemia, ef 69%))  . Nocturia   . Prostate cancer [facility]) urologist--- [place]   dx 03/ 2022,  Stage T1c,  Gleason 3+4    Past Surgical History:  Procedure Laterality Date  . COLONOSCOPY N/A 07/22/2013   Procedure: COLONOSCOPY;  Surgeon: Jamesetta So, MD;  [facility];  Service: Gastroenterology;  Laterality: N/A;  . CYSTOSCOPY/RETROGRADE/URETEROSCOPY  07-31-2009  @[facility]     Family History  Problem Relation Age of Onset  . Breast cancer Sister   . Cervical cancer Sister   . Prostate cancer Neg Hx   . Colon cancer Neg Hx   . Pancreatic cancer Neg Hx    Social History:  reports that he has never smoked. He has never used smokeless tobacco. He reports that he does not drink alcohol and does not use drugs.  Allergies: No Known Allergies  No medications prior to admission.    Results for orders placed or performed during the hospital encounter of 04/24/21 (from the past 48 hour(s))  SARS CORONAVIRUS 2 (TAT 6-24 HRS) Nasopharyngeal Nasopharyngeal Swab     Status: None   Collection Time: 04/24/21  9:03 AM   Specimen: Nasopharyngeal Swab  Result Value Ref Range   SARS Coronavirus 2 NEGATIVE NEGATIVE    Comment: (NOTE) SARS-CoV-2 target nucleic acids are NOT DETECTED.  The SARS-CoV-2 RNA is generally detectable in  upper and lower respiratory specimens during the acute phase of infection. Negative results do not preclude SARS-CoV-2 infection, do not rule out co-infections with other pathogens, and should not be used as the sole basis for treatment or other patient management decisions. Negative results must be combined with clinical observations, patient history, and epidemiological information. The expected result is Negative.  Fact Sheet for Patients: SugarRoll.be  Fact Sheet for Healthcare Providers: https://www.woods-mathews.com/  This test is not yet approved or cleared by the [place] FDA and  has been authorized for detection and/or diagnosis of SARS-CoV-2 by FDA under an Emergency Use Authorization (EUA). This EUA will remain  in effect (meaning this test can be used) for the duration of the COVID-19 declaration under Se ction 564(b)(1) of the Act, 21 U.S.C. section 360bbb-3(b)(1), unless the authorization is terminated or revoked sooner.  Performed at [facility], [place] [address], [place], [place] [address]    No results found.  Review of Systems  All other systems reviewed and are negative.   Height 5\' 8"  (1.727 m), weight 86.2 kg. Physical Exam Vitals reviewed.  HENT:     Head: Normocephalic.  Cardiovascular:     Rate and Rhythm: Normal rate.     Pulses: Normal pulses.  Pulmonary:     Effort: Pulmonary effort is normal.  Abdominal:     General: Abdomen is flat.  Genitourinary:    Comments: No CVAT at present.  Musculoskeletal:        General: Normal range of motion.     Cervical back: Normal range of motion.  Skin:    General: Skin is warm.  Neurological:     General: No focal deficit present.      Assessment/Plan  Proceed as planned with prostate brachytehrapy seed implantation as part of multimodal primary therapy for high risk prostate cancer. Risks, benefits, alternatives, expected peri-op course  discussed in detail.   Alexis Frock, MD 04/25/2021, 7:37 AM

## 2021-04-26 ENCOUNTER — Encounter (HOSPITAL_BASED_OUTPATIENT_CLINIC_OR_DEPARTMENT_OTHER): Payer: Self-pay | Admitting: Urology

## 2021-04-26 NOTE — Progress Notes (Signed)
  Radiation Oncology         ([phone]) [phone] ________________________________  Name: [name] MRN: [id number]  Date: 04/26/2021  DOB: 02-Mar-1956       Prostate Seed Implant  CC:[employer]  No ref. provider found  DIAGNOSIS:  65 y.o. gentleman with Stage T1c adenocarcinoma of the prostate with Gleason score of 4+5, and PSA of 9.8.  Oncology History  Prostate cancer ([place])  01/30/2021 Cancer Staging   Staging form: Prostate, AJCC 8th Edition - Clinical stage from 01/30/2021: Stage IIIC (cT1c, cN0, cM0, PSA: 9.8, Grade Group: 5) - Signed by Freeman Caldron, PA-C on 02/18/2021 Histopathologic type: Adenocarcinoma, NOS Stage prefix: Initial diagnosis Prostate specific antigen (PSA) range: Less than 10 Gleason primary pattern: 4 Gleason secondary pattern: 5 Gleason score: 9 Histologic grading system: 5 grade system Number of biopsy cores examined: 12 Number of biopsy cores positive: 6 Location of positive needle core biopsies: Both sides   02/11/2021 Initial Diagnosis   Prostate cancer ([place])     No diagnosis found.  PROCEDURE: Insertion of radioactive I-125 seeds into the prostate gland.  RADIATION DOSE: 110 Gy, boost therapy.  TECHNIQUE: [name] was brought to the operating room with the urologist. He was placed in the dorsolithotomy position. He was catheterized and a rectal tube was inserted. The perineum was shaved, prepped and draped. The ultrasound probe was then introduced into the rectum to see the prostate gland.  TREATMENT DEVICE: A needle grid was attached to the ultrasound probe stand and anchor needles were placed.  3D PLANNING: The prostate was imaged in 3D using a sagittal sweep of the prostate probe. These images were transferred to the planning computer. There, the prostate, urethra and rectum were defined on each axial reconstructed image. Then, the software created an optimized 3D plan and a few seed positions were adjusted.  The quality of the plan was reviewed using [facility] information for the target and the following two organs at risk:  Urethra and Rectum.  Then the accepted plan was printed and handed off to the radiation therapist.  Under my supervision, the custom loading of the seeds and spacers was carried out and loaded into sealed vicryl sleeves.  These pre-loaded needles were then placed into the needle holder.Marland Kitchen  PROSTATE VOLUME STUDY:  Using transrectal ultrasound the volume of the prostate was verified to be 24.7 cc.  SPECIAL TREATMENT PROCEDURE/SUPERVISION AND HANDLING: The pre-loaded needles were then delivered under sagittal guidance. A total of 17 needles were used to deposit 53 seeds in the prostate gland. The individual seed activity was 0.325 mCi.  SpaceOAR:  Yes  COMPLEX SIMULATION: At the end of the procedure, an anterior radiograph of the pelvis was obtained to document seed positioning and count. Cystoscopy was performed to check the urethra and bladder.  MICRODOSIMETRY: At the end of the procedure, the patient was emitting 0.090 mR/hr at 1 meter. Accordingly, he was considered safe for hospital discharge.  PLAN: The patient will return to the radiation oncology clinic for post implant CT dosimetry in three weeks.   ________________________________  Sheral Apley Tammi Klippel, M.D.

## 2021-04-26 NOTE — Op Note (Signed)
[name], [name] MEDICAL RECORD NO: [id number] ACCOUNT NO: [id number] DATE OF BIRTH: 1956-05-09 FACILITY: [facility] LOCATION: [facility] PHYSICIAN: Alexis Frock, MD  Operative Report   DATE OF PROCEDURE: 04/25/2021  PREOPERATIVE DIAGNOSIS:  High risk prostate cancer.  PROCEDURE PERFORMED:  1.  Prostate brachytherapy seed implantation with intraoperative ultrasound guidance. 2.  Cystoscopy. 3.  SpaceOAR prerectal gel instillation.  ESTIMATED BLOOD LOSS:  Nil.  COMPLICATIONS:  None.  SPECIMENS:  None.  FINDINGS:  1.  Unremakrable urinary bladder following brachytherapy seed implantation. 2.  One-strand tip of Vicryl protruding in bladder, non-worrisome. 3.  Excellent displacement of anterior rectal wall away from the rectum following SpaceOAR.  RADIATION PARAMETERS:  53 seeds placed across 17 catheters.  Total prescribed dose of 110 Gy.  INDICATIONS:  The patient is a very pleasant 65 year old man found on workup of elevated PSA to have significant high risk adenocarcinoma of the prostate.  He underwent staging imaging with the CT and bone scan, which were clinically localized.  Options  were discussed for management including surveillance protocols versus ablative therapy versus surgical extirpation.  He wished to proceed with primary curative intent, multimodality therapy with brachytherapy plus external radiation plus 2 years of  androgen deprivation.  He has already been dosed with androgen deprivation.  He presents for his brachytherapy seed implantation today in conjunction with Dr. Tammi Klippel. Informed consent was obtained and placed in the medical record.  DESCRIPTION OF PROCEDURE:  The patient is being himself verified, procedure being prostate brachytherapy, SpaceOAR implantation, cystoscopy was confirmed.  Procedure timeout was performed.  Intravenous antibiotics were administered.  General LMA  anesthesia induced.  The patient was placed into the median lithotomy  position.  Sterile field was created, prepped and draped his penis, perineum and proximal thighs using iodine.  Foley catheter was placed per urethra to straight drain.  The  transrectal ultrasound probe was introduced as was the brachytherapy grid.  Prostate ultrasound and radiation planning was then performed as per Radiation Oncology documentation.  After final planning, the 17 catheters were used to deliver the 53 seeds  as per the prescribed plan in the anterior, posterior direction to maintain visualization of the prostate. Following this, spot fluoroscopic images revealed no obvious seeds in unexpected location from anterior, posterior and oblique views.  Attention  was directed at [facility] implantation.  The ultrasound was taken off of the anterior tension in the perineal site approximately 1 fingerbreadth anterior to the anus.  The included finder needle was advanced to the mid prostate, mid gland, prerectal fat  area.  A test injection of 2 mL of saline was placed, which revealed an excellent displacement of the prerectal space corroborating proper site of injection.  Next, 10 mL of the SpaceOAR gel matrix was introduced as per manufacturer guidelines over 10  seconds and resulted in excellent ultrasound displacement of the anterior rectal wall away from the posterior aspect of the prostate.  Next, the Foley catheter was removed and cystourethroscopy was performed using a 16-French flexible cystoscope.   Inspection of anterior and posterior urethra was unremarkable.  Inspection of urinary bladder revealed mild trabeculation.  No diverticula or calcifications or papillary lesions. In the right mid posterior aspect of the intravesical prostate, the very  tip of the Vicryl spacer was identified.  There were no obvious brachytherapy seeds in this location. It was felt that the most prudent means of management would be to leave in situ as this is known absorbable and likely of no consequence; however,  manipulation of this strand could result in displacement of the active elements behind it.  No additional findings were found on retroflexion.  The cystoscope was removed.  The procedure was then terminated.  The patient tolerated the procedure well.  No  immediate  complications.  The patient was taken to [facility] in stable condition with plan for discharge home after void.     PAA D: 04/25/2021 2:42:05 pm T: 04/26/2021 8:08:00 am  JOB: [id number]/ [id number]

## 2021-05-06 ENCOUNTER — Telehealth: Payer: Self-pay

## 2021-05-06 NOTE — Telephone Encounter (Signed)
Patient needs to discuss taking this medication : tamsulosin (FLOMAX) 0.4 MG CAPS capsule  Call back:  [phone]  Thanks, [name]

## 2021-05-06 NOTE — Telephone Encounter (Signed)
Returned call. Left message to return call.

## 2021-05-07 NOTE — Telephone Encounter (Signed)
Patient called to ask if he could take flomax once in am and once in pm. Feels he is waking up more in the night with increase urination

## 2021-05-07 NOTE — Telephone Encounter (Signed)
Patient called notified of taking flomax BID.

## 2021-05-08 ENCOUNTER — Other Ambulatory Visit: Payer: Self-pay

## 2021-05-08 ENCOUNTER — Telehealth (INDEPENDENT_AMBULATORY_CARE_PROVIDER_SITE_OTHER): Payer: Medicare Other | Admitting: Urology

## 2021-05-08 ENCOUNTER — Encounter: Payer: Self-pay | Admitting: Urology

## 2021-05-08 DIAGNOSIS — N138 Other obstructive and reflux uropathy: Secondary | ICD-10-CM | POA: Insufficient documentation

## 2021-05-08 DIAGNOSIS — R351 Nocturia: Secondary | ICD-10-CM

## 2021-05-08 DIAGNOSIS — N401 Enlarged prostate with lower urinary tract symptoms: Secondary | ICD-10-CM

## 2021-05-08 MED ORDER — ALFUZOSIN HCL ER 10 MG PO TB24: 10.0000 mg | ORAL_TABLET | Freq: Every day | ORAL | 11 refills | Status: DC

## 2021-05-08 NOTE — Patient Instructions (Signed)

## 2021-05-08 NOTE — Progress Notes (Signed)
05/08/2021 3:06 PM   [name] [birth year] [id number]  Referring provider: [facility] [place],  [place] [address] Patient location: home Physician location: office I connected with  [name] on 05/08/21 by a video enabled telemedicine application and verified that I am speaking with the correct person using two identifiers.   I discussed the limitations of evaluation and management by telemedicine. The patient expressed understanding and agreed to proceed.   nocturia  HPI: [name] is a 65yo here for followup. Since brachytherapy he has noted a weaker stream, urinary frequency and nocturia 3-4x. He is currently on flomax 0.4mg  daily. He is bothered by his worsening urination especially his nocturia. No perineal or rectal pain.    PMH: Past Medical History:  Diagnosis Date  . GERD (gastroesophageal reflux disease)   . History of gout   . Hypertension    followed by pcp   (pt had nuclear study done 07-07-2013 in epic,  normal with no ishemia, ef 69%))  . Nocturia   . Prostate cancer [facility]) urologist--- Carmencita Cusic   dx 03/ 2022,  Stage T1c,  Gleason 3+4    Surgical History: Past Surgical History:  Procedure Laterality Date  . COLONOSCOPY N/A 07/22/2013   Procedure: COLONOSCOPY;  Surgeon: Jamesetta So, MD;  [facility];  Service: Gastroenterology;  Laterality: N/A;  . CYSTOSCOPY N/A 04/25/2021   Procedure: CYSTOSCOPY FLEXIBLE;  Surgeon: Alexis Frock, MD;  Location: [facility];  Service: Urology;  Laterality: N/A;  NO SEEDS FOUND IN BLADDER  . CYSTOSCOPY/RETROGRADE/URETEROSCOPY  07-31-2009  @[facility]   . RADIOACTIVE SEED IMPLANT N/A 04/25/2021   Procedure: RADIOACTIVE SEED IMPLANT/BRACHYTHERAPY IMPLANT;  Surgeon: Alexis Frock, MD;  Location: [facility];  Service: Urology;  Laterality: N/A;  97  SEEDS IMPLANTED  . SPACE OAR INSTILLATION N/A 04/25/2021   Procedure: SPACE OAR  INSTILLATION;  Surgeon: Alexis Frock, MD;  Location: [facility];  Service: Urology;  Laterality: N/A;    Home Medications:  Allergies as of 05/08/2021   No Known Allergies     Medication List       Accurate as of May 08, 2021  3:06 PM. If you have any questions, ask your nurse or doctor.        alfuzosin 10 MG 24 hr tablet Commonly known as: UROXATRAL Take 1 tablet (10 mg total) by mouth at bedtime. Started by: Nicolette Bang, MD   aspirin 81 MG chewable tablet Chew 81 mg by mouth daily.   calcium carbonate 500 MG chewable tablet Commonly known as: TUMS - dosed in mg elemental calcium Chew 1 tablet by mouth as needed for indigestion or heartburn.   FISH OIL PO Take by mouth daily.   indomethacin 50 MG capsule Commonly known as: INDOCIN Take 50 mg by mouth 3 (three) times daily as needed.   lisinopril 10 MG tablet Commonly known as: ZESTRIL Take 1 tablet by mouth daily.   lovastatin 20 MG tablet Commonly known as: MEVACOR Take 20 mg by mouth daily.   megestrol 20 MG tablet Commonly known as: MEGACE Take 1 tablet (20 mg total) by mouth 2 (two) times daily as needed.   tamsulosin 0.4 MG Caps capsule Commonly known as: FLOMAX Take 1 capsule (0.4 mg total) by mouth daily as needed. For urinary urgency / weak stream after prostate radiation   traMADol 50 MG tablet Commonly known as: Ultram Take 1-2 tablets (50-100 mg total) by mouth  every 6 (six) hours as needed for moderate pain. Post-operatively       Allergies: No Known Allergies  Family History: Family History  Problem Relation Age of Onset  . Breast cancer Sister   . Cervical cancer Sister   . Prostate cancer Neg Hx   . Colon cancer Neg Hx   . Pancreatic cancer Neg Hx     Social History:  reports that he has never smoked. He has never used smokeless tobacco. He reports that he does not drink alcohol and does not use drugs.  ROS: All other review of systems were reviewed and  are negative except what is noted above in HPI  Physical Exam: There were no vitals taken for this visit.  Constitutional:  Alert and oriented, No acute distress. HEENT: [place] AT, moist mucus membranes.  Trachea midline, no masses. Cardiovascular: No clubbing, cyanosis, or edema. Respiratory: Normal respiratory effort, no increased work of breathing. GI: Abdomen is soft, nontender, nondistended, no abdominal masses GU: No CVA tenderness.  Lymph: No cervical or inguinal lymphadenopathy. Skin: No rashes, bruises or suspicious lesions. Neurologic: Grossly intact, no focal deficits, moving all 4 extremities. Psychiatric: Normal mood and affect.  Laboratory Data: Lab Results  Component Value Date   WBC 7.0 04/24/2021   HGB 16.8 04/24/2021   HCT 50.0 04/24/2021   MCV 90.3 04/24/2021   PLT 284 04/24/2021    Lab Results  Component Value Date   CREATININE 1.24 04/24/2021    No results found for: PSA  No results found for: TESTOSTERONE  No results found for: HGBA1C  Urinalysis    Component Value Date/Time   APPEARANCEUR Clear 02/22/2021 1207   GLUCOSEU Negative 02/22/2021 1207   BILIRUBINUR Negative 02/22/2021 1207   PROTEINUR Negative 02/22/2021 1207   NITRITE Negative 02/22/2021 1207   LEUKOCYTESUR Negative 02/22/2021 1207    Lab Results  Component Value Date   LABMICR Comment 02/22/2021   WBCUA None seen 01/16/2021   LABEPIT None seen 01/16/2021   MUCUS Present 01/16/2021   BACTERIA None seen 01/16/2021    Pertinent Imaging:  Results for orders placed during the hospital encounter of 07/24/09  DG Abd 1 View  Narrative Clinical Data: Left ureteral calculus  ABDOMEN - 1 VIEW  Comparison: CT on 06/07/2009  Findings: Two adjacent calculi are now seen in the lower left pelvis in the expected region of the ureterovesicle junction. These measure 3-5 mm in size and have passed distally from the left intrarenal collecting system and UPJ region since prior  CT.  IMPRESSION: Two small distal left ureteral calculi, which showed distal migration compared to prior CT.  Provider: Briscoe Burns  No results found for this or any previous visit.  No results found for this or any previous visit.  No results found for this or any previous visit.  No results found for this or any previous visit.  No results found for this or any previous visit.  No results found for this or any previous visit.  No results found for this or any previous visit.   Assessment & Plan:    1. Benign prostatic hyperplasia with urinary obstruction -We will start uroxatral 10mg  qhs  2. Nocturia -uroxatral 10mg  qhs   No follow-ups on file.  Nicolette Bang, MD  [facility] [place]

## 2021-05-09 ENCOUNTER — Telehealth: Payer: Self-pay | Admitting: *Deleted

## 2021-05-09 NOTE — Progress Notes (Signed)
  Radiation Oncology         [phone] ________________________________  Name: BLAYN WHETSELL MRN: [id number]  Date: 05/10/2021  DOB: 09/29/1956  SIMULATION AND TREATMENT PLANNING NOTE    ICD-10-CM   1. Prostate cancer ([place])  C61     DIAGNOSIS:   65 y.o. gentleman with Stage T1c adenocarcinoma of the prostate with Gleason score of 4+5, and PSA of 9.8.  NARRATIVE:  The patient was brought to the [facility].  Identity was confirmed.  All relevant records and images related to the planned course of therapy were reviewed.  The patient freely provided informed written consent to proceed with treatment after reviewing the details related to the planned course of therapy. The consent form was witnessed and verified by the simulation staff.  Then, the patient was set-up in a stable reproducible supine position for radiation therapy.  A vacuum lock pillow device was custom fabricated to position his legs in a reproducible immobilized position.  Then, I performed a urethrogram under sterile conditions to identify the prostatic apex.  CT images were obtained.  Surface markings were placed.  The CT images were loaded into the planning software.  Then the prostate target and avoidance structures including the rectum, bladder, bowel and hips were contoured.  Treatment planning then occurred.  The radiation prescription was entered and confirmed.  A total of one complex treatment devices were fabricated. I have requested : Intensity Modulated Radiotherapy (IMRT) is medically necessary for this case for the following reason:  Rectal sparing.Marland Kitchen  PLAN:  The patient will receive 45 Gy in 25 fractions of 1.8 Gy, to supplement an up-front prostate seed implant boost of 110 Gy to achieve a total nominal dose of 155 Gy.  ________________________________  Sheral Apley Tammi Klippel, M.D.

## 2021-05-09 NOTE — Progress Notes (Signed)
  Radiation Oncology         [phone] ________________________________  Name: [name] MRN: [id number]  Date: 05/10/2021  DOB: May 01, 1956  COMPLEX SIMULATION NOTE  NARRATIVE:  The patient was brought to the [facility] today following prostate seed implantation approximately one month ago.  Identity was confirmed.  All relevant records and images related to the planned course of therapy were reviewed.  Then, the patient was set-up supine.  CT images were obtained.  The CT images were loaded into the planning software.  Then the prostate and rectum were contoured.  Treatment planning then occurred.  The implanted iodine 125 seeds were identified by the physics staff for projection of radiation distribution  I have requested : 3D Simulation  I have requested a DVH of the following structures: Prostate and rectum.    ________________________________  Sheral Apley Tammi Klippel, M.D.

## 2021-05-09 NOTE — Telephone Encounter (Signed)
CALLED PATIENT TO REMIND OF SIM APPT. FOR 05-10-21, LVM FOR A RETURN CALL

## 2021-05-10 ENCOUNTER — Ambulatory Visit
Admission: RE | Admit: 2021-05-10 | Discharge: 2021-05-10 | Disposition: A | Discharge status: Home / Self Care | Payer: Medicare Other | Source: Ambulatory Visit | Attending: Radiation Oncology | Admitting: Radiation Oncology

## 2021-05-10 DIAGNOSIS — C61 Malignant neoplasm of prostate: Secondary | ICD-10-CM | POA: Insufficient documentation

## 2021-05-13 DIAGNOSIS — C61 Malignant neoplasm of prostate: Secondary | ICD-10-CM | POA: Diagnosis not present

## 2021-05-21 ENCOUNTER — Ambulatory Visit
Admission: RE | Admit: 2021-05-21 | Discharge: 2021-05-21 | Disposition: A | Discharge status: Home / Self Care | Payer: Medicare Other | Source: Ambulatory Visit | Attending: Radiation Oncology | Admitting: Radiation Oncology

## 2021-05-21 ENCOUNTER — Other Ambulatory Visit: Payer: Self-pay

## 2021-05-21 DIAGNOSIS — C61 Malignant neoplasm of prostate: Secondary | ICD-10-CM | POA: Diagnosis not present

## 2021-05-22 ENCOUNTER — Ambulatory Visit
Admission: RE | Admit: 2021-05-22 | Discharge: 2021-05-22 | Disposition: A | Discharge status: Home / Self Care | Payer: Medicare Other | Source: Ambulatory Visit | Attending: Radiation Oncology | Admitting: Radiation Oncology

## 2021-05-22 ENCOUNTER — Other Ambulatory Visit: Payer: Self-pay

## 2021-05-22 DIAGNOSIS — C61 Malignant neoplasm of prostate: Secondary | ICD-10-CM | POA: Diagnosis not present

## 2021-05-23 ENCOUNTER — Telehealth: Payer: Self-pay | Admitting: Urology

## 2021-05-23 ENCOUNTER — Ambulatory Visit
Admission: RE | Admit: 2021-05-23 | Discharge: 2021-05-23 | Disposition: A | Discharge status: Home / Self Care | Payer: Medicare Other | Source: Ambulatory Visit | Attending: Radiation Oncology | Admitting: Radiation Oncology

## 2021-05-23 DIAGNOSIS — C61 Malignant neoplasm of prostate: Secondary | ICD-10-CM | POA: Diagnosis not present

## 2021-05-23 NOTE — Telephone Encounter (Signed)
Patient had called and left a messge to see if he could take the Flomax and the Alfuzosin together. Per Dr. Jeffie Pollock its not recommended and I left a message to call back.

## 2021-05-23 NOTE — Telephone Encounter (Signed)
I called the patient and he reports that he will not take both medications per the provider recommendations. He wants to just take the flomax at this time. No other concerns.

## 2021-05-24 ENCOUNTER — Other Ambulatory Visit: Payer: Self-pay

## 2021-05-24 ENCOUNTER — Ambulatory Visit
Admission: RE | Admit: 2021-05-24 | Discharge: 2021-05-24 | Disposition: A | Discharge status: Home / Self Care | Payer: Medicare Other | Source: Ambulatory Visit | Attending: Radiation Oncology | Admitting: Radiation Oncology

## 2021-05-24 DIAGNOSIS — C61 Malignant neoplasm of prostate: Secondary | ICD-10-CM | POA: Diagnosis not present

## 2021-05-28 ENCOUNTER — Other Ambulatory Visit: Payer: Self-pay

## 2021-05-28 ENCOUNTER — Ambulatory Visit
Admission: RE | Admit: 2021-05-28 | Discharge: 2021-05-28 | Disposition: A | Discharge status: Home / Self Care | Payer: Medicare Other | Source: Ambulatory Visit | Attending: Radiation Oncology | Admitting: Radiation Oncology

## 2021-05-28 DIAGNOSIS — C61 Malignant neoplasm of prostate: Secondary | ICD-10-CM | POA: Diagnosis not present

## 2021-05-29 ENCOUNTER — Ambulatory Visit
Admission: RE | Admit: 2021-05-29 | Discharge: 2021-05-29 | Disposition: A | Discharge status: Home / Self Care | Payer: Medicare Other | Source: Ambulatory Visit | Attending: Radiation Oncology | Admitting: Radiation Oncology

## 2021-05-29 DIAGNOSIS — C61 Malignant neoplasm of prostate: Secondary | ICD-10-CM | POA: Insufficient documentation

## 2021-05-30 ENCOUNTER — Other Ambulatory Visit: Payer: Self-pay

## 2021-05-30 ENCOUNTER — Ambulatory Visit
Admission: RE | Admit: 2021-05-30 | Discharge: 2021-05-30 | Disposition: A | Discharge status: Home / Self Care | Payer: Medicare Other | Source: Ambulatory Visit | Attending: Radiation Oncology | Admitting: Radiation Oncology

## 2021-05-30 DIAGNOSIS — C61 Malignant neoplasm of prostate: Secondary | ICD-10-CM | POA: Diagnosis not present

## 2021-05-31 ENCOUNTER — Ambulatory Visit
Admission: RE | Admit: 2021-05-31 | Discharge: 2021-05-31 | Disposition: A | Discharge status: Home / Self Care | Payer: Medicare Other | Source: Ambulatory Visit | Attending: Radiation Oncology | Admitting: Radiation Oncology

## 2021-05-31 DIAGNOSIS — C61 Malignant neoplasm of prostate: Secondary | ICD-10-CM | POA: Diagnosis not present

## 2021-06-03 ENCOUNTER — Ambulatory Visit
Admission: RE | Admit: 2021-06-03 | Discharge: 2021-06-03 | Disposition: A | Discharge status: Home / Self Care | Payer: Medicare Other | Source: Ambulatory Visit | Attending: Radiation Oncology | Admitting: Radiation Oncology

## 2021-06-03 ENCOUNTER — Other Ambulatory Visit: Payer: Self-pay

## 2021-06-03 DIAGNOSIS — C61 Malignant neoplasm of prostate: Secondary | ICD-10-CM | POA: Diagnosis not present

## 2021-06-04 ENCOUNTER — Ambulatory Visit
Admission: RE | Admit: 2021-06-04 | Discharge: 2021-06-04 | Disposition: A | Discharge status: Home / Self Care | Payer: Medicare Other | Source: Ambulatory Visit | Attending: Radiation Oncology | Admitting: Radiation Oncology

## 2021-06-04 DIAGNOSIS — C61 Malignant neoplasm of prostate: Secondary | ICD-10-CM | POA: Diagnosis not present

## 2021-06-05 ENCOUNTER — Ambulatory Visit
Admission: RE | Admit: 2021-06-05 | Discharge: 2021-06-05 | Disposition: A | Discharge status: Home / Self Care | Payer: Medicare Other | Source: Ambulatory Visit | Attending: Radiation Oncology | Admitting: Radiation Oncology

## 2021-06-05 ENCOUNTER — Other Ambulatory Visit: Payer: Self-pay

## 2021-06-05 DIAGNOSIS — C61 Malignant neoplasm of prostate: Secondary | ICD-10-CM | POA: Diagnosis not present

## 2021-06-06 ENCOUNTER — Ambulatory Visit
Admission: RE | Admit: 2021-06-06 | Discharge: 2021-06-06 | Disposition: A | Discharge status: Home / Self Care | Payer: Medicare Other | Source: Ambulatory Visit | Attending: Radiation Oncology | Admitting: Radiation Oncology

## 2021-06-06 DIAGNOSIS — C61 Malignant neoplasm of prostate: Secondary | ICD-10-CM | POA: Diagnosis not present

## 2021-06-06 MED ORDER — TAMSULOSIN HCL 0.4 MG PO CAPS: 0.4000 mg | ORAL_CAPSULE | Freq: Two times a day (BID) | ORAL | 10 refills | Status: DC

## 2021-06-06 NOTE — Addendum Note (Signed)
Addended by: Dessie Coma on: 06/06/2021 03:06 PM   Modules accepted: Orders

## 2021-06-07 ENCOUNTER — Ambulatory Visit
Admission: RE | Admit: 2021-06-07 | Discharge: 2021-06-07 | Disposition: A | Discharge status: Home / Self Care | Payer: Medicare Other | Source: Ambulatory Visit | Attending: Radiation Oncology | Admitting: Radiation Oncology

## 2021-06-07 ENCOUNTER — Other Ambulatory Visit: Payer: Self-pay

## 2021-06-07 DIAGNOSIS — C61 Malignant neoplasm of prostate: Secondary | ICD-10-CM | POA: Diagnosis not present

## 2021-06-10 ENCOUNTER — Other Ambulatory Visit: Payer: Self-pay

## 2021-06-10 ENCOUNTER — Ambulatory Visit
Admission: RE | Admit: 2021-06-10 | Discharge: 2021-06-10 | Disposition: A | Discharge status: Home / Self Care | Payer: Medicare Other | Source: Ambulatory Visit | Attending: Radiation Oncology | Admitting: Radiation Oncology

## 2021-06-10 ENCOUNTER — Encounter: Payer: Self-pay | Admitting: Radiation Oncology

## 2021-06-10 DIAGNOSIS — C61 Malignant neoplasm of prostate: Secondary | ICD-10-CM | POA: Diagnosis not present

## 2021-06-11 ENCOUNTER — Ambulatory Visit
Admission: RE | Admit: 2021-06-11 | Discharge: 2021-06-11 | Disposition: A | Discharge status: Home / Self Care | Payer: Medicare Other | Source: Ambulatory Visit | Attending: Radiation Oncology | Admitting: Radiation Oncology

## 2021-06-11 DIAGNOSIS — C61 Malignant neoplasm of prostate: Secondary | ICD-10-CM | POA: Diagnosis not present

## 2021-06-12 ENCOUNTER — Other Ambulatory Visit: Payer: Self-pay

## 2021-06-12 ENCOUNTER — Ambulatory Visit
Admission: RE | Admit: 2021-06-12 | Discharge: 2021-06-12 | Disposition: A | Discharge status: Home / Self Care | Payer: Medicare Other | Source: Ambulatory Visit | Attending: Radiation Oncology | Admitting: Radiation Oncology

## 2021-06-12 DIAGNOSIS — C61 Malignant neoplasm of prostate: Secondary | ICD-10-CM | POA: Diagnosis not present

## 2021-06-13 ENCOUNTER — Ambulatory Visit
Admission: RE | Admit: 2021-06-13 | Discharge: 2021-06-13 | Disposition: A | Discharge status: Home / Self Care | Payer: Medicare Other | Source: Ambulatory Visit | Attending: Radiation Oncology | Admitting: Radiation Oncology

## 2021-06-13 ENCOUNTER — Other Ambulatory Visit: Payer: Self-pay

## 2021-06-13 DIAGNOSIS — C61 Malignant neoplasm of prostate: Secondary | ICD-10-CM | POA: Diagnosis not present

## 2021-06-14 ENCOUNTER — Other Ambulatory Visit: Payer: Self-pay

## 2021-06-14 ENCOUNTER — Ambulatory Visit
Admission: RE | Admit: 2021-06-14 | Discharge: 2021-06-14 | Disposition: A | Discharge status: Home / Self Care | Payer: Medicare Other | Source: Ambulatory Visit | Attending: Radiation Oncology | Admitting: Radiation Oncology

## 2021-06-14 DIAGNOSIS — C61 Malignant neoplasm of prostate: Secondary | ICD-10-CM | POA: Diagnosis not present

## 2021-06-17 ENCOUNTER — Other Ambulatory Visit: Payer: Self-pay

## 2021-06-17 ENCOUNTER — Ambulatory Visit
Admission: RE | Admit: 2021-06-17 | Discharge: 2021-06-17 | Disposition: A | Discharge status: Home / Self Care | Payer: Medicare Other | Source: Ambulatory Visit | Attending: Radiation Oncology | Admitting: Radiation Oncology

## 2021-06-17 DIAGNOSIS — C61 Malignant neoplasm of prostate: Secondary | ICD-10-CM | POA: Diagnosis not present

## 2021-06-18 ENCOUNTER — Ambulatory Visit
Admission: RE | Admit: 2021-06-18 | Discharge: 2021-06-18 | Disposition: A | Discharge status: Home / Self Care | Payer: Medicare Other | Source: Ambulatory Visit | Attending: Radiation Oncology | Admitting: Radiation Oncology

## 2021-06-18 DIAGNOSIS — C61 Malignant neoplasm of prostate: Secondary | ICD-10-CM | POA: Diagnosis not present

## 2021-06-19 ENCOUNTER — Other Ambulatory Visit: Payer: Self-pay

## 2021-06-19 ENCOUNTER — Ambulatory Visit
Admission: RE | Admit: 2021-06-19 | Discharge: 2021-06-19 | Disposition: A | Discharge status: Home / Self Care | Payer: Medicare Other | Source: Ambulatory Visit | Attending: Radiation Oncology | Admitting: Radiation Oncology

## 2021-06-19 DIAGNOSIS — C61 Malignant neoplasm of prostate: Secondary | ICD-10-CM | POA: Diagnosis not present

## 2021-06-20 ENCOUNTER — Ambulatory Visit
Admission: RE | Admit: 2021-06-20 | Discharge: 2021-06-20 | Disposition: A | Discharge status: Home / Self Care | Payer: Medicare Other | Source: Ambulatory Visit | Attending: Radiation Oncology | Admitting: Radiation Oncology

## 2021-06-20 DIAGNOSIS — C61 Malignant neoplasm of prostate: Secondary | ICD-10-CM | POA: Diagnosis not present

## 2021-06-21 ENCOUNTER — Other Ambulatory Visit: Payer: Self-pay

## 2021-06-21 ENCOUNTER — Ambulatory Visit
Admission: RE | Admit: 2021-06-21 | Discharge: 2021-06-21 | Disposition: A | Discharge status: Home / Self Care | Payer: Medicare Other | Source: Ambulatory Visit | Attending: Radiation Oncology | Admitting: Radiation Oncology

## 2021-06-21 DIAGNOSIS — C61 Malignant neoplasm of prostate: Secondary | ICD-10-CM | POA: Diagnosis not present

## 2021-06-24 ENCOUNTER — Ambulatory Visit
Admission: RE | Admit: 2021-06-24 | Discharge: 2021-06-24 | Disposition: A | Discharge status: Home / Self Care | Payer: Medicare Other | Source: Ambulatory Visit | Attending: Radiation Oncology | Admitting: Radiation Oncology

## 2021-06-24 ENCOUNTER — Other Ambulatory Visit: Payer: Self-pay

## 2021-06-24 DIAGNOSIS — C61 Malignant neoplasm of prostate: Secondary | ICD-10-CM | POA: Diagnosis not present

## 2021-06-25 ENCOUNTER — Encounter: Payer: Self-pay | Admitting: Urology

## 2021-06-25 ENCOUNTER — Ambulatory Visit
Admission: RE | Admit: 2021-06-25 | Discharge: 2021-06-25 | Disposition: A | Discharge status: Home / Self Care | Payer: Medicare Other | Source: Ambulatory Visit | Attending: Radiation Oncology | Admitting: Radiation Oncology

## 2021-06-25 DIAGNOSIS — C61 Malignant neoplasm of prostate: Secondary | ICD-10-CM | POA: Diagnosis not present

## 2021-06-26 NOTE — Progress Notes (Signed)
  Radiation Oncology         [phone]) [phone] ________________________________  Name: [name] MRN: [id number]  Date: 06/10/2021  DOB: Sep 21, 1956  3D Planning Note   Prostate Brachytherapy Post-Implant Dosimetry  Diagnosis: 65 y.o. gentleman with Stage T1c adenocarcinoma of the prostate with Gleason score of 4+5, and PSA of 9.8.  Narrative: On a previous date, [name] returned following prostate seed implantation for post implant planning. He underwent CT scan complex simulation to delineate the three-dimensional structures of the pelvis and demonstrate the radiation distribution.  Since that time, the seed localization, and complex isodose planning with dose volume histograms have now been completed.  Results:   Prostate Coverage - The dose of radiation delivered to the 90% or more of the prostate gland (D90) was 101.96% of the prescription dose. This exceeds our goal of greater than 90%. Rectal Sparing - The volume of rectal tissue receiving the prescription dose or higher was 0.0 cc. This falls under our thresholds tolerance of 1.0 cc.  Impression: The prostate seed implant appears to show adequate target coverage and appropriate rectal sparing.  Plan:  The patient will continue to follow with urology for ongoing PSA determinations. I would anticipate a high likelihood for local tumor control with minimal risk for rectal morbidity.  ________________________________  Sheral Apley Tammi Klippel, M.D.

## 2021-08-05 ENCOUNTER — Telehealth: Payer: Self-pay

## 2021-08-05 ENCOUNTER — Encounter: Payer: Self-pay | Admitting: Urology

## 2021-08-05 NOTE — Progress Notes (Signed)
Patient denies pain, dysuria, and hematuria but is having difficulty starting urine stream. Once started, urine stream is weak. Nocturia x2, without frequency or urgency. No diarrhea or constipation and skin is intact. Patient is taking Flomax as directed.  I-PSS Score 14 (Moderate)..  There were no vitals taken for this visit.

## 2021-08-07 ENCOUNTER — Ambulatory Visit
Admission: RE | Admit: 2021-08-07 | Discharge: 2021-08-07 | Disposition: A | Discharge status: Home / Self Care | Payer: Medicare Other | Source: Ambulatory Visit | Attending: Urology | Admitting: Urology

## 2021-08-07 DIAGNOSIS — C61 Malignant neoplasm of prostate: Secondary | ICD-10-CM

## 2021-08-07 NOTE — Progress Notes (Signed)
  Radiation Oncology         [phone] ________________________________  Name: [name] MRN: [id number]  Date: 06/25/2021  DOB: [birth year]  End of Treatment Note  Diagnosis:    65 y.o. gentleman with Stage T1c adenocarcinoma of the prostate with Gleason score of 4+5, and PSA of 9.8.     Indication for treatment:  Curative, Definitive Radiotherapy       Radiation treatment dates:    05/21/21 - 06/25/21:  IMRT prostate and pelvic nodes  04/25/21:  Brachytherapy  Site/dose:    1. The prostate and pelvic lymph nodes were treated to 45 Gy in 25 fractions of 1.8 Gy, to supplement an up-front prostate seed implant boost of 110 Gy to achieve a total nominal dose of 155 Gy.  2. Insertion of radioactive I-125 seeds into the prostate gland;110 Gy, boost therapy.  Beams/energy:   The patient was treated with IMRT using volumetric arc therapy delivering 6 MV X-rays to clockwise and counterclockwise circumferential arcs with a 90 degree collimator offset to avoid dose scalloping.  Image guidance was performed with daily cone beam CT prior to each fraction to align to gold markers in the prostate and assure proper bladder and rectal fill volumes.  Immobilization was achieved with [employer] custom mold.  Narrative: The patient tolerated radiation treatment relatively well with only minor urinary irritation and modest fatigue.  He did experience some hesitancy and intermittency both of which were improved with Flomax twice daily.  He also reported diarrhea that was managed with Imodium as needed.  Plan: The patient has completed radiation treatment. He will return to radiation oncology clinic for routine followup in one month. I advised him to call or return sooner if he has any questions or concerns related to his recovery or treatment. ________________________________  Sheral Apley. Tammi Klippel, M.D.

## 2021-08-07 NOTE — Progress Notes (Signed)
Radiation Oncology         [phone] ________________________________  Name: [name] MRN: [id number]  Date: 08/07/2021  DOB: 29-May-1956  Post Treatment Note  CC: [facility]  McKenzie, Candee Furbish, MD  Diagnosis:   65 y.o. gentleman with Stage T1c adenocarcinoma of the prostate with Gleason score of 4+5, and PSA of 9.8.  Interval Since Last Radiation:  6 weeks (concurrent with ADT, Eligard injection 02/22/21) 05/21/21 - 06/25/21:  The prostate and pelvic lymph nodes were treated to 45 Gy in 25 fractions of 1.8 Gy, to supplement an up-front prostate seed implant boost of 110 Gy to achieve a total nominal dose of 155 Gy.   04/25/21:  Insertion of radioactive I-125 seeds into the prostate gland;110 Gy, boost therapy.  Narrative:  I spoke with the patient to conduct his routine scheduled 1 month follow up visit via telephone to spare the patient unnecessary potential exposure in the healthcare setting during the current COVID-19 pandemic.  The patient was notified in advance and gave permission to proceed with this visit format.   He tolerated radiation treatment relatively well with only minor urinary irritation and modest fatigue.  He did experience some hesitancy and intermittency both of which were improved with Flomax twice daily.  He also reported diarrhea that was managed with Imodium as needed.                              On review of systems, the patient states that he is doing well in general. He continues with a weak flow of stream and hesitancy, particularly after he has been lying down for any period of time or when he gets up in the night to empty his bladder.  Otherwise, he feels that he is able to empty his bladder well throughout the day with a decent flow of stream.  He has continued taking Flomax twice daily as prescribed.  He specifically denies dysuria, gross hematuria, straining to void, incomplete bladder emptying or incontinence.  He denies  abdominal pain, nausea, vomiting, diarrhea or constipation.  His appetite is good and he is maintaining his weight.  He reports persistent fatigue/decreased stamina but is staying active despite.  Overall, he is pleased with his progress to date.  ALLERGIES:  has No Known Allergies.  Meds: Current Outpatient Medications  Medication Sig Dispense Refill   alfuzosin (UROXATRAL) 10 MG 24 hr tablet Take 1 tablet (10 mg total) by mouth at bedtime. 30 tablet 11   aspirin 81 MG chewable tablet Chew 81 mg by mouth daily.     calcium carbonate (TUMS - DOSED IN MG ELEMENTAL CALCIUM) 500 MG chewable tablet Chew 1 tablet by mouth as needed for indigestion or heartburn.     indomethacin (INDOCIN) 50 MG capsule Take 50 mg by mouth 3 (three) times daily as needed.     lisinopril (ZESTRIL) 10 MG tablet Take 1 tablet by mouth daily.     lovastatin (MEVACOR) 20 MG tablet Take 20 mg by mouth daily.     megestrol (MEGACE) 20 MG tablet Take 1 tablet (20 mg total) by mouth 2 (two) times daily as needed. (Patient taking differently: Take 20 mg by mouth 2 (two) times daily as needed.) 60 tablet 2   Omega-3 Fatty Acids (FISH OIL PO) Take by mouth daily.     tamsulosin (FLOMAX) 0.4 MG CAPS capsule Take 1 capsule (0.4 mg total) by  mouth in the morning and at bedtime. For urinary urgency / weak stream after prostate radiation 60 capsule 10   traMADol (ULTRAM) 50 MG tablet Take 1-2 tablets (50-100 mg total) by mouth every 6 (six) hours as needed for moderate pain. Post-operatively 15 tablet 0   No current facility-administered medications for this encounter.    Physical Findings:  vitals were not taken for this visit.  Pain Assessment Pain Score: 0-No pain/10 Unable to assess due to telephone follow-up visit format.  Lab Findings: Lab Results  Component Value Date   WBC 7.0 04/24/2021   HGB 16.8 04/24/2021   HCT 50.0 04/24/2021   MCV 90.3 04/24/2021   PLT 284 04/24/2021     Radiographic Findings: No results  found.  Impression/Plan: 1. 65 y.o. gentleman with Stage T1c adenocarcinoma of the prostate with Gleason score of 4+5, and PSA of 9.8. He will continue to follow up with urology for ongoing PSA determinations and but does not currently have an appointment scheduled with Dr. Alyson Ingles to his knowledge.  He has continued to tolerate the ADT fairly well despite fatigue and anticipates continuing on this therapy for a total of 2 years, under the care and direction of Dr. Alyson Ingles.  He will be due for his next Eligard injection around the end of August 2022 or early September 2022.  He understands what to expect with regards to PSA monitoring going forward. I will look forward to following his response to treatment via correspondence with urology, and would be happy to continue to participate in his care if clinically indicated. I talked to the patient about what to expect in the future, including his risk for erectile dysfunction and rectal bleeding. I encouraged him to call or return to the office if he has any questions regarding his previous radiation or possible radiation side effects. He was comfortable with this plan and will follow up as needed.     Nicholos Johns, PA-C

## 2021-08-21 ENCOUNTER — Other Ambulatory Visit: Payer: Self-pay | Admitting: Urology

## 2021-08-21 DIAGNOSIS — C61 Malignant neoplasm of prostate: Secondary | ICD-10-CM

## 2021-08-24 ENCOUNTER — Encounter: Payer: Self-pay | Admitting: Urology

## 2021-08-28 NOTE — Telephone Encounter (Signed)
N/A error

## 2021-09-04 ENCOUNTER — Other Ambulatory Visit: Payer: Self-pay

## 2021-09-04 ENCOUNTER — Encounter: Payer: Self-pay | Admitting: Urology

## 2021-09-04 ENCOUNTER — Ambulatory Visit (INDEPENDENT_AMBULATORY_CARE_PROVIDER_SITE_OTHER): Payer: Medicare Other | Admitting: Urology

## 2021-09-04 VITALS — BP 112/64 | HR 93

## 2021-09-04 DIAGNOSIS — N138 Other obstructive and reflux uropathy: Secondary | ICD-10-CM

## 2021-09-04 DIAGNOSIS — R351 Nocturia: Secondary | ICD-10-CM | POA: Diagnosis not present

## 2021-09-04 DIAGNOSIS — N401 Enlarged prostate with lower urinary tract symptoms: Secondary | ICD-10-CM

## 2021-09-04 DIAGNOSIS — C61 Malignant neoplasm of prostate: Secondary | ICD-10-CM

## 2021-09-04 MED ORDER — LEUPROLIDE ACETATE (6 MONTH) 45 MG ~~LOC~~ KIT
45.0000 mg | PACK | Freq: Once | SUBCUTANEOUS | Status: AC
  Administered 2021-09-04: 45 mg via SUBCUTANEOUS

## 2021-09-04 MED ORDER — MEGESTROL ACETATE 20 MG PO TABS: 20.0000 mg | ORAL_TABLET | Freq: Two times a day (BID) | ORAL | 5 refills | Status: DC | PRN

## 2021-09-04 MED ORDER — TAMSULOSIN HCL 0.4 MG PO CAPS: 0.4000 mg | ORAL_CAPSULE | Freq: Two times a day (BID) | ORAL | 11 refills | Status: DC

## 2021-09-04 NOTE — Patient Instructions (Signed)
Prostate Cancer °The prostate is a small gland that helps make semen. It is located below a man's bladder, in front of the rectum. Prostate cancer is when abnormal cells grow in this gland. °What are the causes? °The cause of this condition is not known. °What increases the risk? °Being age 65 or older. °Having a family history of prostate cancer. °Having a family history of cancer of the breasts or ovaries. °Having genes that are passed from parent to child (inherited). °Having Lynch syndrome. °African American men and men of African descent are diagnosed with prostate cancer at higher rates than other men. °What are the signs or symptoms? °Problems peeing (urinating). This may include: °A stream that is weak, or pee that stops and starts. °Trouble starting or stopping your pee. °Trouble emptying all of your pee. °Needing to pee more often, especially at night. °Blood in your pee or semen. °Pain in the: °Lower back. °Lower belly (abdomen). °Hips. °Trouble getting an erection. °Weakness or numbness in the legs or feet. °How is this treated? °Treatment for this condition depends on: °How much the cancer has spread. °Your age. °The kind of treatment you want. °Your health. °Treatments include: °Being watched. This is called observation. You will be tested from time to time, but you will not get treated. Tests are to make sure that the cancer is not growing. °Surgery. This may be done to: °Take out (remove) the prostate. °Freeze and kill cancer cells. °Radiation. This uses a strong beam of energy to kill cancer cells. °Chemotherapy. This uses medicines that stop cancer cells from increasing. This kills cancer cells and healthy cells. °Targeted therapy. This kills cancer cells only. Healthy cells are not affected. °Hormone treatment. This stops the body from making hormones that help the cancer cells grow. °Follow these instructions at home: °Lifestyle °Do not smoke or use any products that contain nicotine or tobacco.  If you need help quitting, ask your doctor. °Eat a healthy diet. °Treatment may affect your ability to have sex. If you have a partner, touch, hold, hug, and caress your partner to have intimate moments. °Get plenty of sleep. °Ask your doctor for help to find a support group for men with prostate cancer. °General instructions °Take over-the-counter and prescription medicines only as told by your doctor. °If you have to go to the hospital, let your cancer doctor (oncologist) know. °Keep all follow-up visits. °Where to find more information °American Cancer Society: www.cancer.org °American Society of Clinical Oncology: www.cancer.net °National Cancer Institute: www.cancer.gov °Contact a doctor if: °You have new or more trouble peeing. °You have new or more blood in your pee. °You have new or more pain in your hips, back, or chest. °Get help right away if: °You have weakness in your legs. °You lose feeling in your legs. °You cannot control your pee or your poop (stool). °You have chills or a fever. °Summary °The prostate is a male gland that helps make semen. °Prostate cancer is when abnormal cells grow in this gland. °Treatment includes doing surgery, using medicines, using strong beams of energy, or watching without treatment. °Ask your doctor for help to find a support group for men with prostate cancer. °Contact a doctor if you have problems peeing or have any new pain that you did not have before. °This information is not intended to replace advice given to you by your health care provider. Make sure you discuss any questions you have with your health care provider. °Document Revised: 03/13/2021 Document Reviewed: 03/13/2021 °Elsevier   Patient Education © 2022 Elsevier Inc. ° °

## 2021-09-04 NOTE — Progress Notes (Signed)
09/04/2021 3:40 PM   [name] 03-24-1956 [id number]  Referring provider: [facility] [place],  [place] [address]  Followup prostate cancer and nocturia   HPI: [name] is a [id number] here for followup for prostate cancer and nocturia. No recent PSA. He is due for eligard '45mg'$  today. He has moderate hot flashes which are worse at night. He takes megace prn.  He mild LUTS on flomax 0.'4mg'$  BID. Nocturia 1-2x. IPSS 12 QOL 3. He has an intermittent weak stream. His stream is weaker when he first wakes up in the morning. No dysuria or hematuria.    PMH: Past Medical History:  Diagnosis Date   GERD (gastroesophageal reflux disease)    History of gout    Hypertension    followed by pcp   (pt had nuclear study done 07-07-2013 in epic,  normal with no ishemia, ef 69%))   Nocturia    Prostate cancer [facility]) urologist--- [name]   dx 03/ 2022,  Stage T1c,  Gleason 3+4    Surgical History: Past Surgical History:  Procedure Laterality Date   COLONOSCOPY N/A 07/22/2013   Procedure: COLONOSCOPY;  Surgeon: Scott So, MD;  [facility];  Service: Gastroenterology;  Laterality: N/A;   CYSTOSCOPY N/A 04/25/2021   Procedure: CYSTOSCOPY FLEXIBLE;  Surgeon: Scott Frock, MD;  Location: [facility];  Service: Urology;  Laterality: N/A;  NO SEEDS FOUND IN BLADDER   CYSTOSCOPY/RETROGRADE/URETEROSCOPY  07-31-2009  '@[facility]'$    RADIOACTIVE SEED IMPLANT N/A 04/25/2021   Procedure: RADIOACTIVE SEED IMPLANT/BRACHYTHERAPY IMPLANT;  Surgeon: Scott Frock, MD;  Location: [facility];  Service: Urology;  Laterality: N/A;  67  SEEDS IMPLANTED   SPACE OAR INSTILLATION N/A 04/25/2021   Procedure: SPACE OAR INSTILLATION;  Surgeon: Scott Frock, MD;  Location: [facility];  Service: Urology;  Laterality: N/A;    Home Medications:  Allergies as of 09/04/2021   No Known Allergies      Medication List         Accurate as of September 04, 2021  3:40 PM. If you have any questions, ask your nurse or doctor.          alfuzosin 10 MG 24 hr tablet Commonly known as: UROXATRAL Take 1 tablet (10 mg total) by mouth at bedtime.   aspirin 81 MG chewable tablet Chew 81 mg by mouth daily.   calcium carbonate 500 MG chewable tablet Commonly known as: TUMS - dosed in mg elemental calcium Chew 1 tablet by mouth as needed for indigestion or heartburn.   FISH OIL PO Take by mouth daily.   indomethacin 50 MG capsule Commonly known as: INDOCIN Take 50 mg by mouth 3 (three) times daily as needed.   lisinopril 10 MG tablet Commonly known as: ZESTRIL Take 1 tablet by mouth daily.   lovastatin 20 MG tablet Commonly known as: MEVACOR Take 20 mg by mouth daily.   megestrol 20 MG tablet Commonly known as: MEGACE Take 1 tablet (20 mg total) by mouth 2 (two) times daily as needed.   tamsulosin 0.4 MG Caps capsule Commonly known as: FLOMAX Take 1 capsule (0.4 mg total) by mouth in the morning and at bedtime. For urinary urgency / weak stream after prostate radiation   traMADol 50 MG tablet Commonly known as: Ultram Take 1-2 tablets (50-100 mg total) by mouth every 6 (six) hours as needed for moderate pain. Post-operatively        Allergies:  No Known Allergies  Family History: Family History  Problem Relation Age of Onset   Breast cancer Sister    Cervical cancer Sister    Prostate cancer Neg Hx    Colon cancer Neg Hx    Pancreatic cancer Neg Hx     Social History:  reports that he has never smoked. He has never used smokeless tobacco. He reports that he does not drink alcohol and does not use drugs.  ROS: All other review of systems were reviewed and are negative except what is noted above in HPI  Physical Exam: BP 112/64   Pulse 93   Constitutional:  Alert and oriented, No acute distress. HEENT: [place] AT, moist mucus membranes.  Trachea midline, no masses. Cardiovascular: No clubbing,  cyanosis, or edema. Respiratory: Normal respiratory effort, no increased work of breathing. GI: Abdomen is soft, nontender, nondistended, no abdominal masses GU: No CVA tenderness.  Lymph: No cervical or inguinal lymphadenopathy. Skin: No rashes, bruises or suspicious lesions. Neurologic: Grossly intact, no focal deficits, moving all 4 extremities. Psychiatric: Normal mood and affect.  Laboratory Data: Lab Results  Component Value Date   WBC 7.0 04/24/2021   HGB 16.8 04/24/2021   HCT 50.0 04/24/2021   MCV 90.3 04/24/2021   PLT 284 04/24/2021    Lab Results  Component Value Date   CREATININE 1.24 04/24/2021    No results found for: PSA  No results found for: TESTOSTERONE  No results found for: HGBA1C  Urinalysis    Component Value Date/Time   APPEARANCEUR Clear 02/22/2021 1207   GLUCOSEU Negative 02/22/2021 1207   BILIRUBINUR Negative 02/22/2021 1207   PROTEINUR Negative 02/22/2021 1207   NITRITE Negative 02/22/2021 1207   LEUKOCYTESUR Negative 02/22/2021 1207    Lab Results  Component Value Date   LABMICR Comment 02/22/2021   WBCUA None seen 01/16/2021   LABEPIT None seen 01/16/2021   MUCUS Present 01/16/2021   BACTERIA None seen 01/16/2021    Pertinent Imaging:  Results for orders placed during the hospital encounter of 07/24/09  DG Abd 1 View  Narrative Clinical Data: Left ureteral calculus  ABDOMEN - 1 VIEW  Comparison: CT on 06/07/2009  Findings: Two adjacent calculi are now seen in the lower left pelvis in the expected region of the ureterovesicle junction. These measure 3-5 mm in size and have passed distally from the left intrarenal collecting system and UPJ region since prior CT.  IMPRESSION: Two small distal left ureteral calculi, which showed distal migration compared to prior CT.  Provider: Briscoe Baird  No results found for this or any previous visit.  No results found for this or any previous visit.  No results found for  this or any previous visit.  No results found for this or any previous visit.  No results found for this or any previous visit.  No results found for this or any previous visit.  No results found for this or any previous visit.   Assessment & Plan:    1. Prostate cancer ([place]) -Eligard '45mg'$  today -RTC 3 months with PSA  2. BPH with nocturia -Continue flomax 0.'4mg'$  BID   No follow-ups on file.  Nicolette Bang, MD  [facility] [place]

## 2021-09-04 NOTE — Progress Notes (Signed)
Urological Symptom Review  Patient is experiencing the following symptoms: none   Review of Systems  Gastrointestinal (upper)  : Negative for upper GI symptoms  Gastrointestinal (lower) : Negative for lower GI symptoms  Constitutional : Negative for symptoms  Skin: Negative for skin symptoms  Eyes: Negative for eye symptoms  Ear/Nose/Throat : Negative for Ear/Nose/Throat symptoms  Hematologic/Lymphatic: Negative for Hematologic/Lymphatic symptoms  Cardiovascular : Negative for cardiovascular symptoms  Respiratory : Negative for respiratory symptoms  Endocrine: Negative for endocrine symptoms  Musculoskeletal: Negative for musculoskeletal symptoms  Neurological: Negative for neurological symptoms  Psychologic: Negative for psychiatric symptoms  Eligard SubQ Injection   Due to Prostate Cancer patient is present today for a Eligard Injection.  Medication: Eligard 6 month Dose: 45 mg  Location: left hip Lot: [id number] Exp: 01/2023  Patient tolerated well, no complications were noted  Performed by: Cassie Henkels LPN

## 2021-09-27 ENCOUNTER — Telehealth: Payer: Self-pay | Admitting: Adult Health

## 2021-09-27 NOTE — Telephone Encounter (Signed)
Called and reviewed SCP visit with patient and benefit.  He would like to set up a phone visit.  Schedule message sent.    Wilber Bihari, NP

## 2021-10-02 ENCOUNTER — Telehealth: Payer: Self-pay | Admitting: Adult Health

## 2021-10-02 NOTE — Telephone Encounter (Signed)
Scheduled per sch msg. Called, not able to leave msg. Mailed printout  °

## 2021-10-22 ENCOUNTER — Other Ambulatory Visit: Payer: Self-pay

## 2021-10-22 ENCOUNTER — Inpatient Hospital Stay: Payer: Medicare Other | Attending: Adult Health | Admitting: *Deleted

## 2021-10-22 ENCOUNTER — Telehealth: Payer: Self-pay | Admitting: *Deleted

## 2021-10-22 ENCOUNTER — Encounter: Payer: Self-pay | Admitting: *Deleted

## 2021-10-22 DIAGNOSIS — C61 Malignant neoplasm of prostate: Secondary | ICD-10-CM

## 2021-10-22 NOTE — Progress Notes (Signed)
Reviewed  and completed SCP visit with patient. SDOH assessment completed. Pt receives Eligard every  6 months does have some episodes of hot flashes; no needs or barriers noted at this time. Pt has recently  began taking Levothyroxine. 2 identifiers used for verification with this appt.

## 2021-12-02 ENCOUNTER — Other Ambulatory Visit: Payer: Self-pay

## 2021-12-02 ENCOUNTER — Other Ambulatory Visit: Payer: Medicare Other

## 2021-12-02 DIAGNOSIS — C61 Malignant neoplasm of prostate: Secondary | ICD-10-CM

## 2021-12-03 LAB — PSA: Prostate Specific Ag, Serum: <0.1 ng/mL (ref 0.0–4.0)

## 2021-12-09 ENCOUNTER — Other Ambulatory Visit: Payer: Self-pay

## 2021-12-09 ENCOUNTER — Encounter: Payer: Self-pay | Admitting: Urology

## 2021-12-09 ENCOUNTER — Telehealth: Payer: Self-pay

## 2021-12-09 ENCOUNTER — Ambulatory Visit (INDEPENDENT_AMBULATORY_CARE_PROVIDER_SITE_OTHER): Payer: Medicare Other | Admitting: Urology

## 2021-12-09 VITALS — BP 134/70 | HR 72

## 2021-12-09 DIAGNOSIS — N401 Enlarged prostate with lower urinary tract symptoms: Secondary | ICD-10-CM

## 2021-12-09 DIAGNOSIS — C61 Malignant neoplasm of prostate: Secondary | ICD-10-CM

## 2021-12-09 DIAGNOSIS — R351 Nocturia: Secondary | ICD-10-CM

## 2021-12-09 DIAGNOSIS — N138 Other obstructive and reflux uropathy: Secondary | ICD-10-CM

## 2021-12-09 MED ORDER — TAMSULOSIN HCL 0.4 MG PO CAPS: 0.4000 mg | ORAL_CAPSULE | Freq: Every day | ORAL | 11 refills | Status: DC

## 2021-12-09 NOTE — Progress Notes (Signed)
Urological Symptom Review  Patient is experiencing the following symptoms: Get up at night to urinate   Review of Systems  Gastrointestinal (upper)  : Negative for upper GI symptoms  Gastrointestinal (lower) : Negative for lower GI symptoms  Constitutional : Night Sweats  Skin: Negative for skin symptoms  Eyes: Negative for eye symptoms  Ear/Nose/Throat : Negative for Ear/Nose/Throat symptoms  Hematologic/Lymphatic: Negative for Hematologic/Lymphatic symptoms  Cardiovascular : Negative for cardiovascular symptoms  Respiratory : Negative for respiratory symptoms  Endocrine: Negative for endocrine symptoms  Musculoskeletal: Negative for musculoskeletal symptoms  Neurological: Negative for neurological symptoms  Psychologic: Negative for psychiatric symptoms

## 2021-12-09 NOTE — Progress Notes (Signed)
12/09/2021 3:55 PM   [name] December 11, 1956 [id number]  Referring provider: [facility] [place],  [place] [address]  Followup prostate cancer and BPH   HPI: [name] is a 46SJ here for followup for prostate cancer and BPH. PSA remains undetectable on eligard. He has stable mild LUTS on flomax 0.4mg  daily. IPSS 3 QOL 0. Urine stream strong. No straining to urinate. Nocturia 0-1x. No other complaints today.    PMH: Past Medical History:  Diagnosis Date   GERD (gastroesophageal reflux disease)    History of gout    Hypertension    followed by pcp   (pt had nuclear study done 07-07-2013 in epic,  normal with no ishemia, ef 69%))   Nocturia    Prostate cancer [facility]) urologist--- Dquan Cortopassi   dx 03/ 2022,  Stage T1c,  Gleason 3+4    Surgical History: Past Surgical History:  Procedure Laterality Date   COLONOSCOPY N/A 07/22/2013   Procedure: COLONOSCOPY;  Surgeon: Jamesetta So, MD;  [facility];  Service: Gastroenterology;  Laterality: N/A;   CYSTOSCOPY N/A 04/25/2021   Procedure: CYSTOSCOPY FLEXIBLE;  Surgeon: Alexis Frock, MD;  Location: [facility];  Service: Urology;  Laterality: N/A;  NO SEEDS FOUND IN BLADDER   CYSTOSCOPY/RETROGRADE/URETEROSCOPY  07-31-2009  @[facility]    RADIOACTIVE SEED IMPLANT N/A 04/25/2021   Procedure: RADIOACTIVE SEED IMPLANT/BRACHYTHERAPY IMPLANT;  Surgeon: Alexis Frock, MD;  Location: [facility];  Service: Urology;  Laterality: N/A;  85  SEEDS IMPLANTED   SPACE OAR INSTILLATION N/A 04/25/2021   Procedure: SPACE OAR INSTILLATION;  Surgeon: Alexis Frock, MD;  Location: [facility];  Service: Urology;  Laterality: N/A;    Home Medications:  Allergies as of 12/09/2021   No Known Allergies      Medication List        Accurate as of December 09, 2021  3:55 PM. If you have any questions, ask your nurse or doctor.          alfuzosin 10 MG 24 hr  tablet Commonly known as: UROXATRAL Take 1 tablet (10 mg total) by mouth at bedtime.   aspirin 81 MG chewable tablet Chew 81 mg by mouth daily.   calcium carbonate 500 MG chewable tablet Commonly known as: TUMS - dosed in mg elemental calcium Chew 1 tablet by mouth as needed for indigestion or heartburn.   FISH OIL PO Take by mouth daily.   indomethacin 50 MG capsule Commonly known as: INDOCIN Take 50 mg by mouth 3 (three) times daily as needed.   levothyroxine 25 MCG tablet Commonly known as: SYNTHROID Take 25 mcg by mouth daily before breakfast.   lisinopril 10 MG tablet Commonly known as: ZESTRIL Take 5 mg by mouth daily.   lovastatin 20 MG tablet Commonly known as: MEVACOR Take 20 mg by mouth daily.   megestrol 20 MG tablet Commonly known as: MEGACE Take 1 tablet (20 mg total) by mouth 2 (two) times daily as needed.   tamsulosin 0.4 MG Caps capsule Commonly known as: FLOMAX Take 1 capsule (0.4 mg total) by mouth in the morning and at bedtime. For urinary urgency / weak stream after prostate radiation   traMADol 50 MG tablet Commonly known as: Ultram Take 1-2 tablets (50-100 mg total) by mouth every 6 (six) hours as needed for moderate pain. Post-operatively        Allergies: No Known Allergies  Family History: Family History  Problem Relation  Age of Onset   Breast cancer Sister    Cervical cancer Sister    Prostate cancer Neg Hx    Colon cancer Neg Hx    Pancreatic cancer Neg Hx     Social History:  reports that he has never smoked. He has never used smokeless tobacco. He reports that he does not drink alcohol and does not use drugs.  ROS: All other review of systems were reviewed and are negative except what is noted above in HPI  Physical Exam: BP 134/70   Pulse 72   Constitutional:  Alert and oriented, No acute distress. HEENT: [place] AT, moist mucus membranes.  Trachea midline, no masses. Cardiovascular: No clubbing, cyanosis, or  edema. Respiratory: Normal respiratory effort, no increased work of breathing. GI: Abdomen is soft, nontender, nondistended, no abdominal masses GU: No CVA tenderness.  Lymph: No cervical or inguinal lymphadenopathy. Skin: No rashes, bruises or suspicious lesions. Neurologic: Grossly intact, no focal deficits, moving all 4 extremities. Psychiatric: Normal mood and affect.  Laboratory Data: Lab Results  Component Value Date   WBC 7.0 04/24/2021   HGB 16.8 04/24/2021   HCT 50.0 04/24/2021   MCV 90.3 04/24/2021   PLT 284 04/24/2021    Lab Results  Component Value Date   CREATININE 1.24 04/24/2021    No results found for: PSA  No results found for: TESTOSTERONE  No results found for: HGBA1C  Urinalysis    Component Value Date/Time   APPEARANCEUR Clear 02/22/2021 1207   GLUCOSEU Negative 02/22/2021 1207   BILIRUBINUR Negative 02/22/2021 1207   PROTEINUR Negative 02/22/2021 1207   NITRITE Negative 02/22/2021 1207   LEUKOCYTESUR Negative 02/22/2021 1207    Lab Results  Component Value Date   LABMICR Comment 02/22/2021   WBCUA None seen 01/16/2021   LABEPIT None seen 01/16/2021   MUCUS Present 01/16/2021   BACTERIA None seen 01/16/2021    Pertinent Imaging:  Results for orders placed during the hospital encounter of 07/24/09  DG Abd 1 View  Narrative Clinical Data: Left ureteral calculus  ABDOMEN - 1 VIEW  Comparison: CT on 06/07/2009  Findings: Two adjacent calculi are now seen in the lower left pelvis in the expected region of the ureterovesicle junction. These measure 3-5 mm in size and have passed distally from the left intrarenal collecting system and UPJ region since prior CT.  IMPRESSION: Two small distal left ureteral calculi, which showed distal migration compared to prior CT.  Provider: Briscoe Burns  No results found for this or any previous visit.  No results found for this or any previous visit.  No results found for this or any  previous visit.  No results found for this or any previous visit.  No results found for this or any previous visit.  No results found for this or any previous visit.  No results found for this or any previous visit.   Assessment & Plan:    1. Benign prostatic hyperplasia with urinary obstruction -continue flomax 0.4mg  daily  - Urinalysis, Routine w reflex microscopic  2. Prostate cancer ([place]) -RTC 3 months with PSA and eligard  3. Nocturia -Continue flomax 0.4mg  daily   No follow-ups on file.  Nicolette Bang, MD  [facility] [place]

## 2021-12-09 NOTE — Telephone Encounter (Signed)
Patient advised he needs medication sent to [place], [place], [place] [phone] [facility]

## 2021-12-09 NOTE — Patient Instructions (Signed)
Prostate Cancer °The prostate is a small gland that helps make semen. It is located below a man's bladder, in front of the rectum. Prostate cancer is when abnormal cells grow in this gland. °What are the causes? °The cause of this condition is not known. °What increases the risk? °Being age 65 or older. °Having a family history of prostate cancer. °Having a family history of cancer of the breasts or ovaries. °Having genes that are passed from parent to child (inherited). °Having Lynch syndrome. °African American men and men of African descent are diagnosed with prostate cancer at higher rates than other men. °What are the signs or symptoms? °Problems peeing (urinating). This may include: °A stream that is weak, or pee that stops and starts. °Trouble starting or stopping your pee. °Trouble emptying all of your pee. °Needing to pee more often, especially at night. °Blood in your pee or semen. °Pain in the: °Lower back. °Lower belly (abdomen). °Hips. °Trouble getting an erection. °Weakness or numbness in the legs or feet. °How is this treated? °Treatment for this condition depends on: °How much the cancer has spread. °Your age. °The kind of treatment you want. °Your health. °Treatments include: °Being watched. This is called observation. You will be tested from time to time, but you will not get treated. Tests are to make sure that the cancer is not growing. °Surgery. This may be done to: °Take out (remove) the prostate. °Freeze and kill cancer cells. °Radiation. This uses a strong beam of energy to kill cancer cells. °Chemotherapy. This uses medicines that stop cancer cells from increasing. This kills cancer cells and healthy cells. °Targeted therapy. This kills cancer cells only. Healthy cells are not affected. °Hormone treatment. This stops the body from making hormones that help the cancer cells grow. °Follow these instructions at home: °Lifestyle °Do not smoke or use any products that contain nicotine or tobacco.  If you need help quitting, ask your doctor. °Eat a healthy diet. °Treatment may affect your ability to have sex. If you have a partner, touch, hold, hug, and caress your partner to have intimate moments. °Get plenty of sleep. °Ask your doctor for help to find a support group for men with prostate cancer. °General instructions °Take over-the-counter and prescription medicines only as told by your doctor. °If you have to go to the hospital, let your cancer doctor (oncologist) know. °Keep all follow-up visits. °Where to find more information °American Cancer Society: www.cancer.org °American Society of Clinical Oncology: www.cancer.net °National Cancer Institute: www.cancer.gov °Contact a doctor if: °You have new or more trouble peeing. °You have new or more blood in your pee. °You have new or more pain in your hips, back, or chest. °Get help right away if: °You have weakness in your legs. °You lose feeling in your legs. °You cannot control your pee or your poop (stool). °You have chills or a fever. °Summary °The prostate is a male gland that helps make semen. °Prostate cancer is when abnormal cells grow in this gland. °Treatment includes doing surgery, using medicines, using strong beams of energy, or watching without treatment. °Ask your doctor for help to find a support group for men with prostate cancer. °Contact a doctor if you have problems peeing or have any new pain that you did not have before. °This information is not intended to replace advice given to you by your health care provider. Make sure you discuss any questions you have with your health care provider. °Document Revised: 03/13/2021 Document Reviewed: 03/13/2021 °Elsevier   Patient Education © 2022 Elsevier Inc. ° °

## 2021-12-09 NOTE — Telephone Encounter (Signed)
Refill submitted of flomax to pharmacy requested.

## 2021-12-10 LAB — URINALYSIS, ROUTINE W REFLEX MICROSCOPIC
Bilirubin, UA: NEGATIVE
Glucose, UA: NEGATIVE
Ketones, UA: NEGATIVE
Leukocytes,UA: NEGATIVE
Nitrite, UA: NEGATIVE
Protein,UA: NEGATIVE
Specific Gravity, UA: >1.03 — ABNORMAL HIGH (ref 1.005–1.030)
Urobilinogen, Ur: 0.2 mg/dL (ref 0.2–1.0)
pH, UA: 5 (ref 5.0–7.5)

## 2021-12-10 LAB — MICROSCOPIC EXAMINATION
Bacteria, UA: NONE SEEN
Epithelial Cells (non renal): NONE SEEN /hpf (ref 0–10)
RBC, Urine: NONE SEEN /hpf (ref 0–2)
Renal Epithel, UA: NONE SEEN /hpf
WBC, UA: NONE SEEN /hpf (ref 0–5)

## 2022-03-12 ENCOUNTER — Ambulatory Visit (INDEPENDENT_AMBULATORY_CARE_PROVIDER_SITE_OTHER): Payer: Medicare Other | Admitting: Urology

## 2022-03-12 ENCOUNTER — Other Ambulatory Visit: Payer: Self-pay

## 2022-03-12 ENCOUNTER — Encounter: Payer: Self-pay | Admitting: Urology

## 2022-03-12 VITALS — BP 107/70 | HR 74

## 2022-03-12 DIAGNOSIS — N401 Enlarged prostate with lower urinary tract symptoms: Secondary | ICD-10-CM

## 2022-03-12 DIAGNOSIS — R351 Nocturia: Secondary | ICD-10-CM

## 2022-03-12 DIAGNOSIS — N138 Other obstructive and reflux uropathy: Secondary | ICD-10-CM | POA: Diagnosis not present

## 2022-03-12 DIAGNOSIS — C61 Malignant neoplasm of prostate: Secondary | ICD-10-CM

## 2022-03-12 MED ORDER — LEUPROLIDE ACETATE (6 MONTH) 45 MG ~~LOC~~ KIT
45.0000 mg | PACK | Freq: Once | SUBCUTANEOUS | Status: AC
  Administered 2022-03-12: 45 mg via SUBCUTANEOUS

## 2022-03-12 MED ORDER — TAMSULOSIN HCL 0.4 MG PO CAPS: 0.4000 mg | ORAL_CAPSULE | Freq: Every day | ORAL | 11 refills | Status: DC

## 2022-03-12 NOTE — Patient Instructions (Signed)
Prostate Cancer °The prostate is a small gland that helps make semen. It is located below a man's bladder, in front of the rectum. Prostate cancer is when abnormal cells grow in this gland. °What are the causes? °The cause of this condition is not known. °What increases the risk? °Being age 67 or older. °Having a family history of prostate cancer. °Having a family history of cancer of the breasts or ovaries. °Having genes that are passed from parent to child (inherited). °Having Lynch syndrome. °African American men and men of African descent are diagnosed with prostate cancer at higher rates than other men. °What are the signs or symptoms? °Problems peeing (urinating). This may include: °A stream that is weak, or pee that stops and starts. °Trouble starting or stopping your pee. °Trouble emptying all of your pee. °Needing to pee more often, especially at night. °Blood in your pee or semen. °Pain in the: °Lower back. °Lower belly (abdomen). °Hips. °Trouble getting an erection. °Weakness or numbness in the legs or feet. °How is this treated? °Treatment for this condition depends on: °How much the cancer has spread. °Your age. °The kind of treatment you want. °Your health. °Treatments include: °Being watched. This is called observation. You will be tested from time to time, but you will not get treated. Tests are to make sure that the cancer is not growing. °Surgery. This may be done to: °Take out (remove) the prostate. °Freeze and kill cancer cells. °Radiation. This uses a strong beam of energy to kill cancer cells. °Chemotherapy. This uses medicines that stop cancer cells from increasing. This kills cancer cells and healthy cells. °Targeted therapy. This kills cancer cells only. Healthy cells are not affected. °Hormone treatment. This stops the body from making hormones that help the cancer cells grow. °Follow these instructions at home: °Lifestyle °Do not smoke or use any products that contain nicotine or tobacco.  If you need help quitting, ask your doctor. °Eat a healthy diet. °Treatment may affect your ability to have sex. If you have a partner, touch, hold, hug, and caress your partner to have intimate moments. °Get plenty of sleep. °Ask your doctor for help to find a support group for men with prostate cancer. °General instructions °Take over-the-counter and prescription medicines only as told by your doctor. °If you have to go to the hospital, let your cancer doctor (oncologist) know. °Keep all follow-up visits. °Where to find more information °American Cancer Society: www.cancer.org °American Society of Clinical Oncology: www.cancer.net °National Cancer Institute: www.cancer.gov °Contact a doctor if: °You have new or more trouble peeing. °You have new or more blood in your pee. °You have new or more pain in your hips, back, or chest. °Get help right away if: °You have weakness in your legs. °You lose feeling in your legs. °You cannot control your pee or your poop (stool). °You have chills or a fever. °Summary °The prostate is a male gland that helps make semen. °Prostate cancer is when abnormal cells grow in this gland. °Treatment includes doing surgery, using medicines, using strong beams of energy, or watching without treatment. °Ask your doctor for help to find a support group for men with prostate cancer. °Contact a doctor if you have problems peeing or have any new pain that you did not have before. °This information is not intended to replace advice given to you by your health care provider. Make sure you discuss any questions you have with your health care provider. °Document Revised: 03/13/2021 Document Reviewed: 03/13/2021 °Elsevier   Patient Education © 2022 Elsevier Inc. ° °

## 2022-03-12 NOTE — Progress Notes (Signed)
Eligard SubQ Injection  ? ?Due to Prostate Cancer patient is present today for a Eligard Injection. ? ?Medication: Eligard 6 month ?Dose: 45 mg  ?Location: right  ?Lot: [id number] ?Exp: 06/2023 ? ?Patient tolerated well, no complications were noted ? ?Performed by: Davy Faught LPN ? ?

## 2022-03-12 NOTE — Progress Notes (Signed)
? ?03/12/2022 ?4:18 PM  ? ?Festus Holts ?July 05, 1956 ?[id number] ? ?Referring provider: [facility] [address] [address],  [place] [address] ? ?Followup BPH and prostate cancer ? ? ?HPI: ?[name] is a 66yo here for for prostate cancer and BPH. IPSS 7 QOL 3 on flomax. Nocturia 1-2x. Urine stream strong. No straining to urinate. No hematuria or dysuria. No recent PSA. He is due for eligard today. Mild hot flashes. Mild fatigue. No other complaints today ? ? ?PMH: ?Past Medical History:  ?Diagnosis Date  ? GERD (gastroesophageal reflux disease)   ? History of gout   ? Hypertension   ? followed by pcp   (pt had nuclear study done 07-07-2013 in epic,  normal with no ishemia, ef 69%))  ? Nocturia   ? Prostate cancer [facility]) urologist--- Sanora Cunanan  ? dx 03/ 2022,  Stage T1c,  Gleason 3+4  ? ? ?Surgical History: ?Past Surgical History:  ?Procedure Laterality Date  ? COLONOSCOPY N/A 07/22/2013  ? Procedure: COLONOSCOPY;  Surgeon: Jamesetta So, MD;  [facility];  Service: Gastroenterology;  Laterality: N/A;  ? CYSTOSCOPY N/A 04/25/2021  ? Procedure: CYSTOSCOPY FLEXIBLE;  Surgeon: Alexis Frock, MD;  Location: [facility];  Service: Urology;  Laterality: N/A;  NO SEEDS FOUND IN BLADDER  ? CYSTOSCOPY/RETROGRADE/URETEROSCOPY  07-31-2009  '@[facility]'$   ? RADIOACTIVE SEED IMPLANT N/A 04/25/2021  ? Procedure: RADIOACTIVE SEED IMPLANT/BRACHYTHERAPY IMPLANT;  Surgeon: Alexis Frock, MD;  Location: [facility];  Service: Urology;  Laterality: N/A;  53  SEEDS IMPLANTED  ? SPACE OAR INSTILLATION N/A 04/25/2021  ? Procedure: SPACE OAR INSTILLATION;  Surgeon: Alexis Frock, MD;  Location: [facility];  Service: Urology;  Laterality: N/A;  ? ? ?Home Medications:  ?Allergies as of 03/12/2022   ?No Known Allergies ?  ? ?  ?Medication List  ?  ? ?  ? Accurate as of March 12, 2022  4:18 PM. If you have any questions, ask your nurse or doctor.  ?  ?  ? ?  ? ?aspirin  81 MG chewable tablet ?Chew 81 mg by mouth daily. ?  ?calcium carbonate 500 MG chewable tablet ?Commonly known as: TUMS - dosed in mg elemental calcium ?Chew 1 tablet by mouth as needed for indigestion or heartburn. ?  ?FISH OIL PO ?Take by mouth daily. ?  ?indomethacin 50 MG capsule ?Commonly known as: INDOCIN ?Take 50 mg by mouth 3 (three) times daily as needed. ?  ?levothyroxine 25 MCG tablet ?Commonly known as: SYNTHROID ?Take 25 mcg by mouth daily before breakfast. ?  ?lisinopril 10 MG tablet ?Commonly known as: ZESTRIL ?Take 5 mg by mouth daily. ?  ?lovastatin 20 MG tablet ?Commonly known as: MEVACOR ?Take 20 mg by mouth daily. ?  ?megestrol 20 MG tablet ?Commonly known as: MEGACE ?Take 1 tablet (20 mg total) by mouth 2 (two) times daily as needed. ?  ?tamsulosin 0.4 MG Caps capsule ?Commonly known as: FLOMAX ?Take 1 capsule (0.4 mg total) by mouth daily after supper. For urinary urgency / weak stream after prostate radiation ?  ?traMADol 50 MG tablet ?Commonly known as: Ultram ?Take 1-2 tablets (50-100 mg total) by mouth every 6 (six) hours as needed for moderate pain. Post-operatively ?  ? ?  ? ? ?Allergies: No Known Allergies ? ?Family History: ?Family History  ?Problem Relation Age of Onset  ? Breast cancer Sister   ? Cervical cancer Sister   ? Prostate cancer Neg Hx   ?  Colon cancer Neg Hx   ? Pancreatic cancer Neg Hx   ? ? ?Social History:  reports that he has never smoked. He has never used smokeless tobacco. He reports that he does not drink alcohol and does not use drugs. ? ?ROS: ?All other review of systems were reviewed and are negative except what is noted above in HPI ? ?Physical Exam: ?BP 107/70   Pulse 74   ?Constitutional:  Alert and oriented, No acute distress. ?HEENT: [place] AT, moist mucus membranes.  Trachea midline, no masses. ?Cardiovascular: No clubbing, cyanosis, or edema. ?Respiratory: Normal respiratory effort, no increased work of breathing. ?GI: Abdomen is soft, nontender,  nondistended, no abdominal masses ?GU: No CVA tenderness.  ?Lymph: No cervical or inguinal lymphadenopathy. ?Skin: No rashes, bruises or suspicious lesions. ?Neurologic: Grossly intact, no focal deficits, moving all 4 extremities. ?Psychiatric: Normal mood and affect. ? ?Laboratory Data: ?Lab Results  ?Component Value Date  ? WBC 7.0 04/24/2021  ? HGB 16.8 04/24/2021  ? HCT 50.0 04/24/2021  ? MCV 90.3 04/24/2021  ? PLT 284 04/24/2021  ? ? ?Lab Results  ?Component Value Date  ? CREATININE 1.24 04/24/2021  ? ? ?No results found for: PSA ? ?No results found for: TESTOSTERONE ? ?No results found for: HGBA1C ? ?Urinalysis ?   ?Component Value Date/Time  ? APPEARANCEUR Clear 12/09/2021 1539  ? GLUCOSEU Negative 12/09/2021 1539  ? BILIRUBINUR Negative 12/09/2021 1539  ? PROTEINUR Negative 12/09/2021 1539  ? NITRITE Negative 12/09/2021 1539  ? LEUKOCYTESUR Negative 12/09/2021 1539  ? ? ?Lab Results  ?Component Value Date  ? LABMICR See below: 12/09/2021  ? [place] None seen 12/09/2021  ? LABEPIT None seen 12/09/2021  ? MUCUS Present 01/16/2021  ? BACTERIA None seen 12/09/2021  ? ? ?Pertinent Imaging: ? ?Results for orders placed during the hospital encounter of 07/24/09 ? ?DG Abd 1 View ? ?Narrative ?Clinical Data: Left ureteral calculus ? ?ABDOMEN - 1 VIEW ? ?Comparison: CT on 06/07/2009 ? ?Findings: Two adjacent calculi are now seen in the lower left ?pelvis in the expected region of the ureterovesicle junction. ?These measure 3-5 mm in size and have passed distally from the left ?intrarenal collecting system and UPJ region since prior CT. ? ?IMPRESSION: ?Two small distal left ureteral calculi, which showed distal ?migration compared to prior CT. ? ?Provider: Briscoe Burns ? ?No results found for this or any previous visit. ? ?No results found for this or any previous visit. ? ?No results found for this or any previous visit. ? ?No results found for this or any previous visit. ? ?No results found for this or any previous  visit. ? ?No results found for this or any previous visit. ? ?No results found for this or any previous visit. ? ? ?Assessment & Plan:   ? ?1. Prostate cancer ([place]) ?PSA today, RTC 6 months with PSA ?- Urinalysis, Routine w reflex microscopic ?- leuprolide (6 Month) (ELIGARD) injection 45 mg ? ?2. Benign prostatic hyperplasia with urinary obstruction ?-Continue flomax 0.'4mg'$  daily ? ?3. Nocturia ?-continue flomax 0.'4mg'$  daily ? ? ?No follow-ups on file. ? ?Nicolette Bang, MD ? ?[facility] [place] ?  ?

## 2022-03-13 LAB — URINALYSIS, ROUTINE W REFLEX MICROSCOPIC
Bilirubin, UA: NEGATIVE
Glucose, UA: NEGATIVE
Ketones, UA: NEGATIVE
Leukocytes,UA: NEGATIVE
Nitrite, UA: NEGATIVE
Protein,UA: NEGATIVE
RBC, UA: NEGATIVE
Specific Gravity, UA: >1.03 — ABNORMAL HIGH (ref 1.005–1.030)
Urobilinogen, Ur: 0.2 mg/dL (ref 0.2–1.0)
pH, UA: 5.5 (ref 5.0–7.5)

## 2022-03-13 LAB — PSA: Prostate Specific Ag, Serum: <0.1 ng/mL (ref 0.0–4.0)

## 2022-05-08 IMAGING — CT CT ABD-PELV W/O CM
2 of 4 series · 16 of 46 positions shown, 18 images · non-contrast
Comparison: 03/15/2021

CLINICAL DATA: Prostate cancer staging.

EXAM:
CT ABDOMEN AND PELVIS WITHOUT CONTRAST
TECHNIQUE: Multidetector CT imaging of the abdomen and pelvis was performed
following the standard protocol without IV contrast.

[Series 2: axial st · axial · 0.88mm/px · z∈[+761,+1261]mm · 13 of 114 slices shown, 15 images]
[im 7/114  soft-tissue]
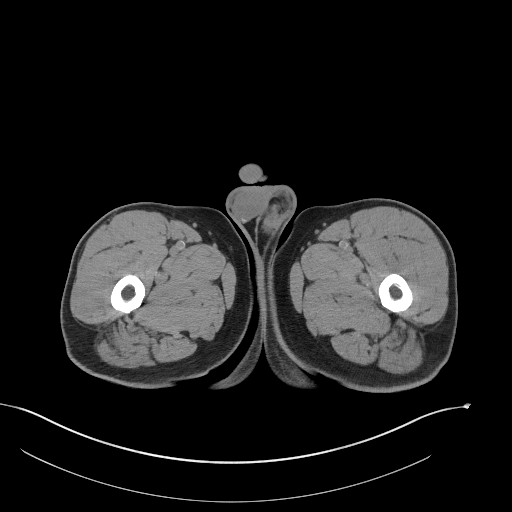
[im 7/114  bone]
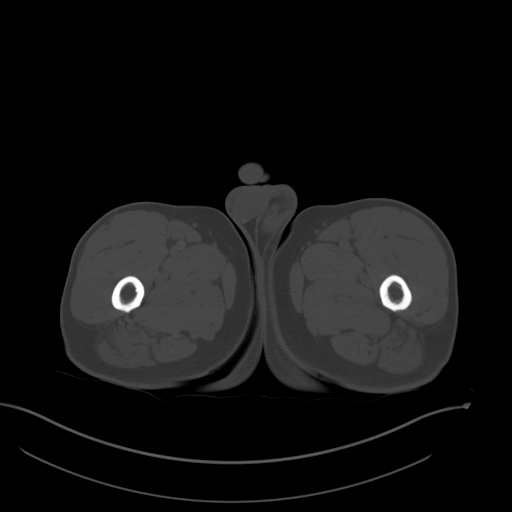
[im 14/114  soft-tissue]
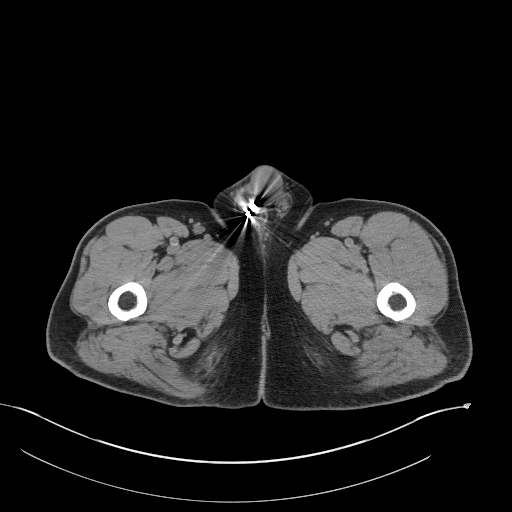
[im 27/114  soft-tissue]
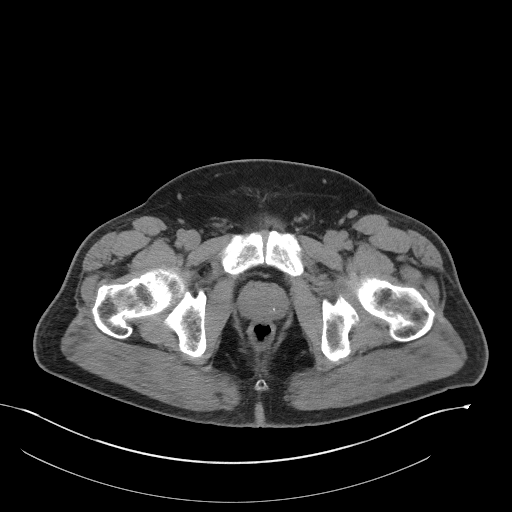
[im 34/114  soft-tissue]
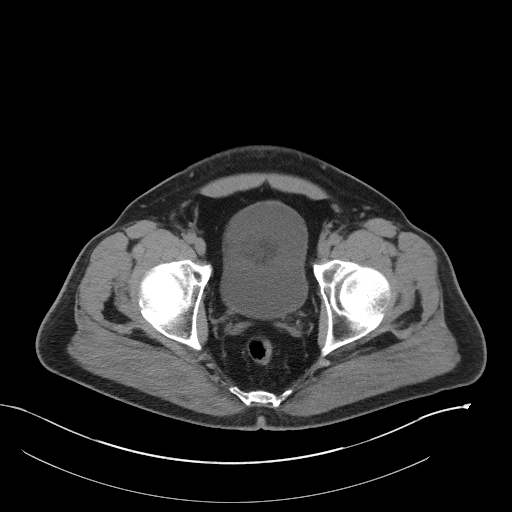
[im 40/114  soft-tissue]
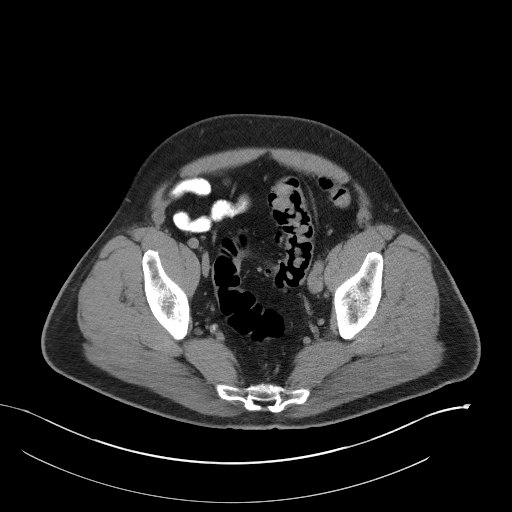
[im 47/114  soft-tissue]
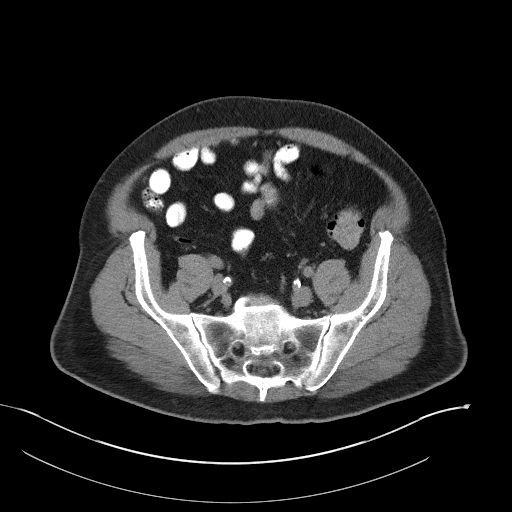
[im 60/114  soft-tissue]
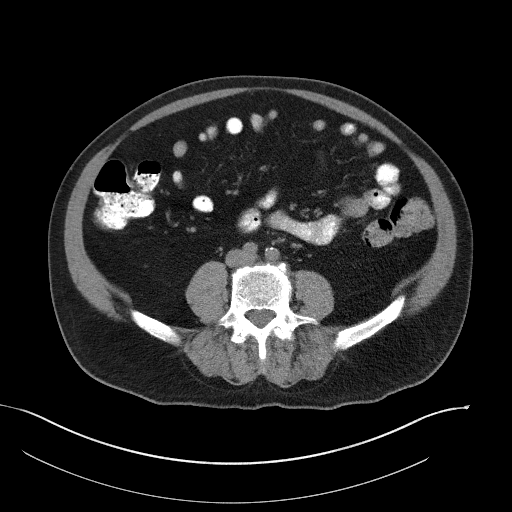
[im 67/114  soft-tissue]
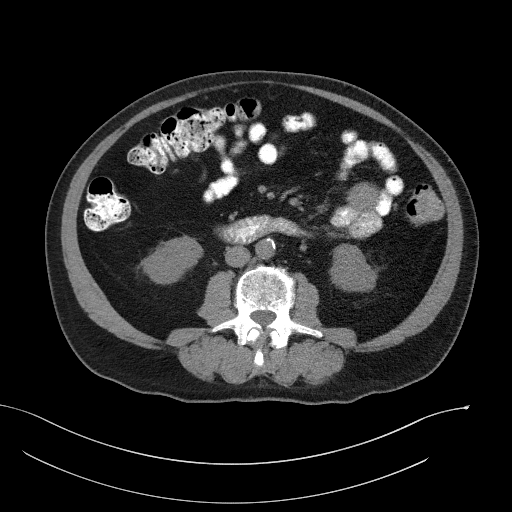
[im 74/114  soft-tissue]
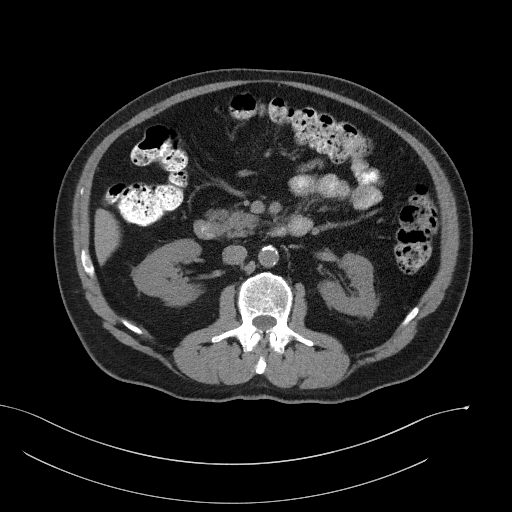
[im 74/114  bone]
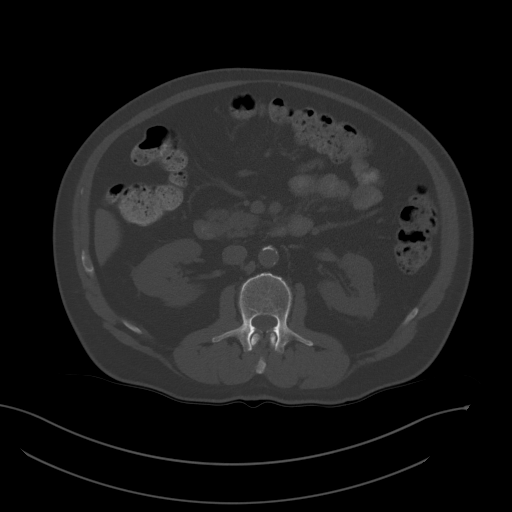
[im 80/114  soft-tissue]
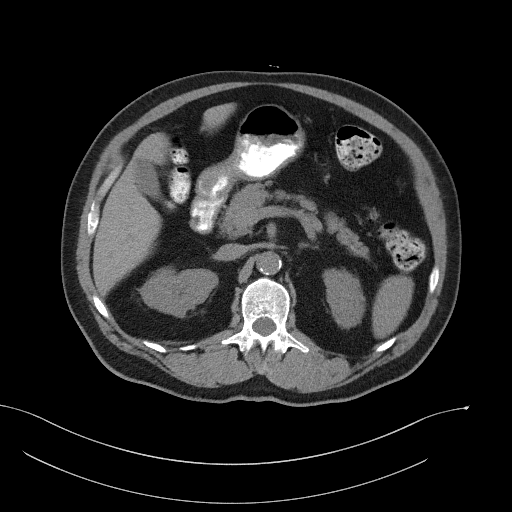
[im 87/114  soft-tissue]
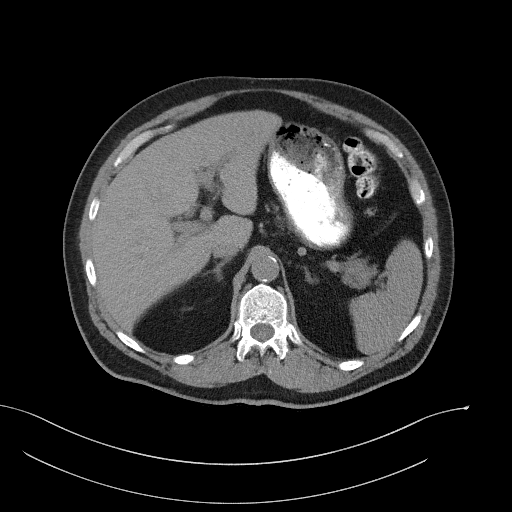
[im 100/114  soft-tissue]
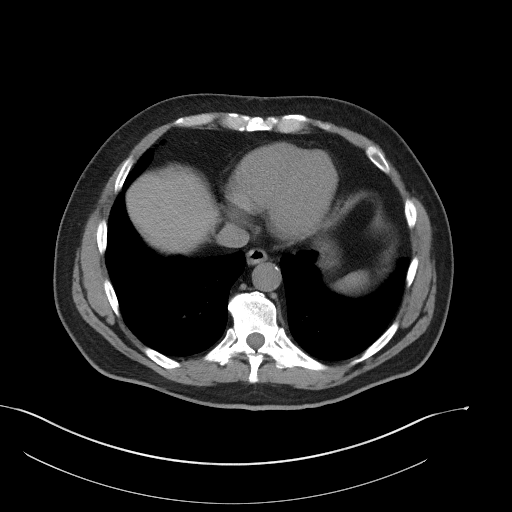
[im 107/114  soft-tissue]
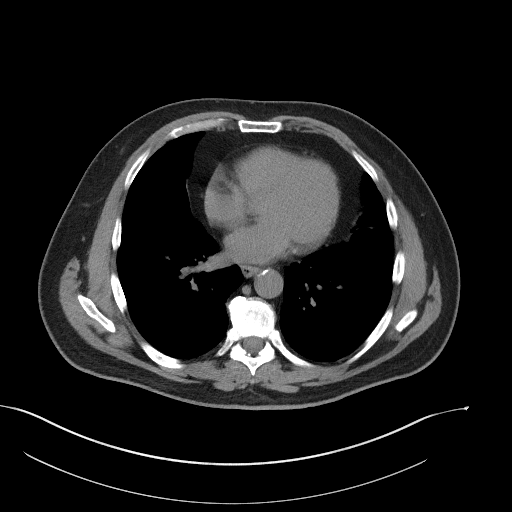

[Series 5: coronal st · coronal · 0.82mm/px · 3 of 114 slices shown]
[im 38/114  soft-tissue]
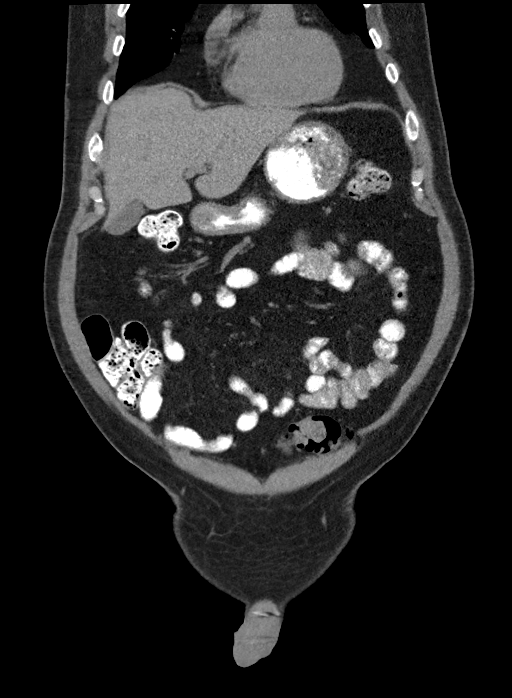
[im 51/114  soft-tissue]
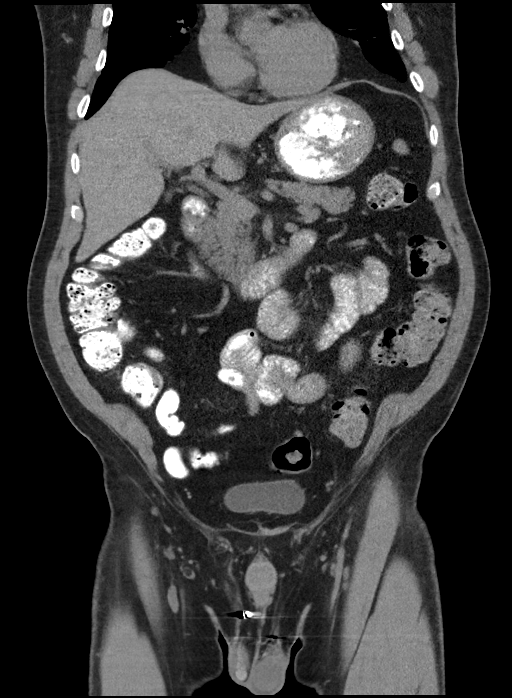
[im 63/114  soft-tissue]
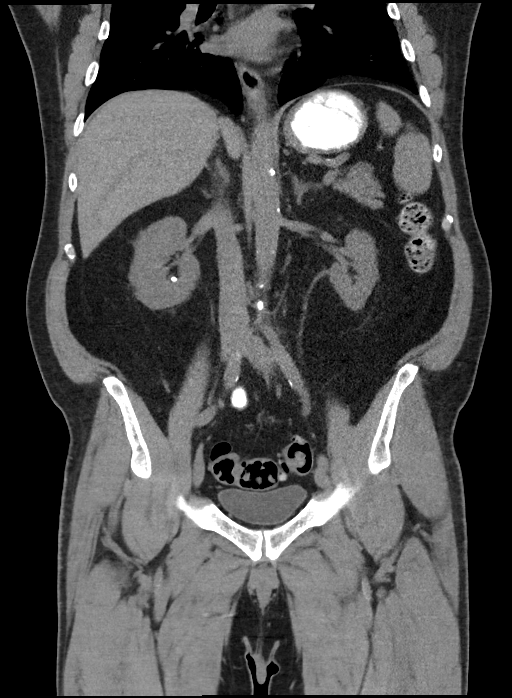

[16 of 46 positions shown; findings below may reference images not displayed]

FINDINGS: Lower chest: Scarring noted within the right middle lobe and
lingula. Small calcified granuloma noted in the right middle lobe,
image [DATE].

Hepatobiliary: No focal liver abnormality is seen. No gallstones,
gallbladder wall thickening, or biliary dilatation.

Pancreas: Unremarkable. No pancreatic ductal dilatation or
surrounding inflammatory changes.

Spleen: Normal in size without focal abnormality.

Adrenals/Urinary Tract: Normal appearance of the adrenal glands.
There are 2 stones within the mid and inferior pole of right kidney
measuring up to 5 mm. Punctate stone noted within upper pole of left
kidney. No hydronephrosis or mass. Urinary bladder unremarkable.

Stomach/Bowel: Stomach appears normal. The appendix is visualized
and is within normal limits. Scattered colonic diverticula noted
within the distal colon.

Vascular/Lymphatic: Aortic atherosclerosis. No enlarged abdominal or
pelvic lymph nodes.

Reproductive: Prostate is unremarkable.

Other: No free fluid or fluid collections.

Musculoskeletal: No acute or significant osseous findings. Lumbar
degenerative disc disease noted at L4-5. Bilateral L5 pars defects
noted. No significant anterolisthesis.
IMPRESSION: 1. No mass or adenopathy identified to suggest prostate cancer
metastasis. No suspicious bone lesions.
2. Nonobstructing bilateral renal calculi.
3. Aortic atherosclerosis.

Aortic Atherosclerosis ([id number]).

## 2022-05-21 IMAGING — DX DG CHEST 2V
2 series · 2 of 2 positions shown · non-contrast
Comparison: None.

CLINICAL DATA: 64-year-old male with preoperative chest x-ray

EXAM:
CHEST - 2 VIEW

[chest pa]
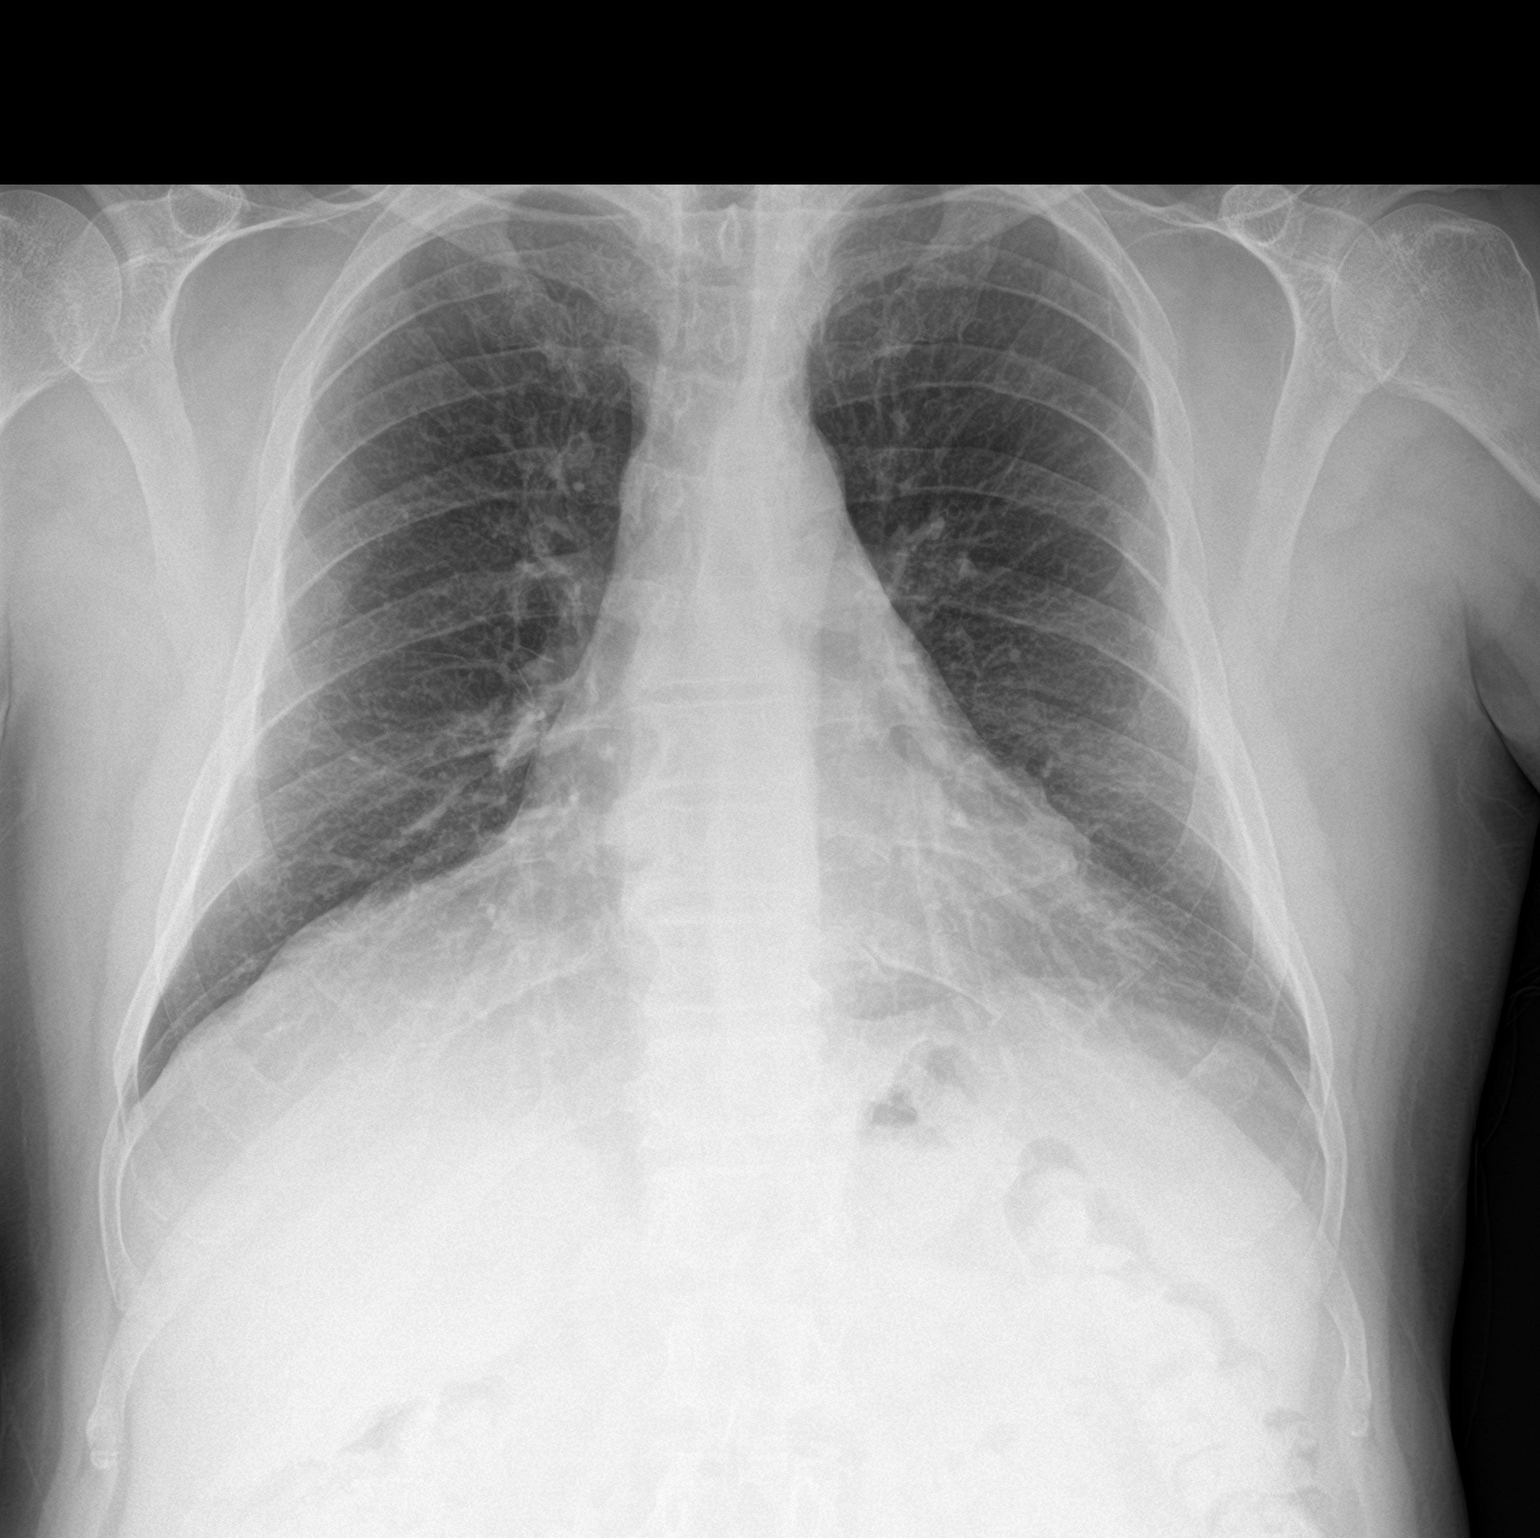

[chest lat]
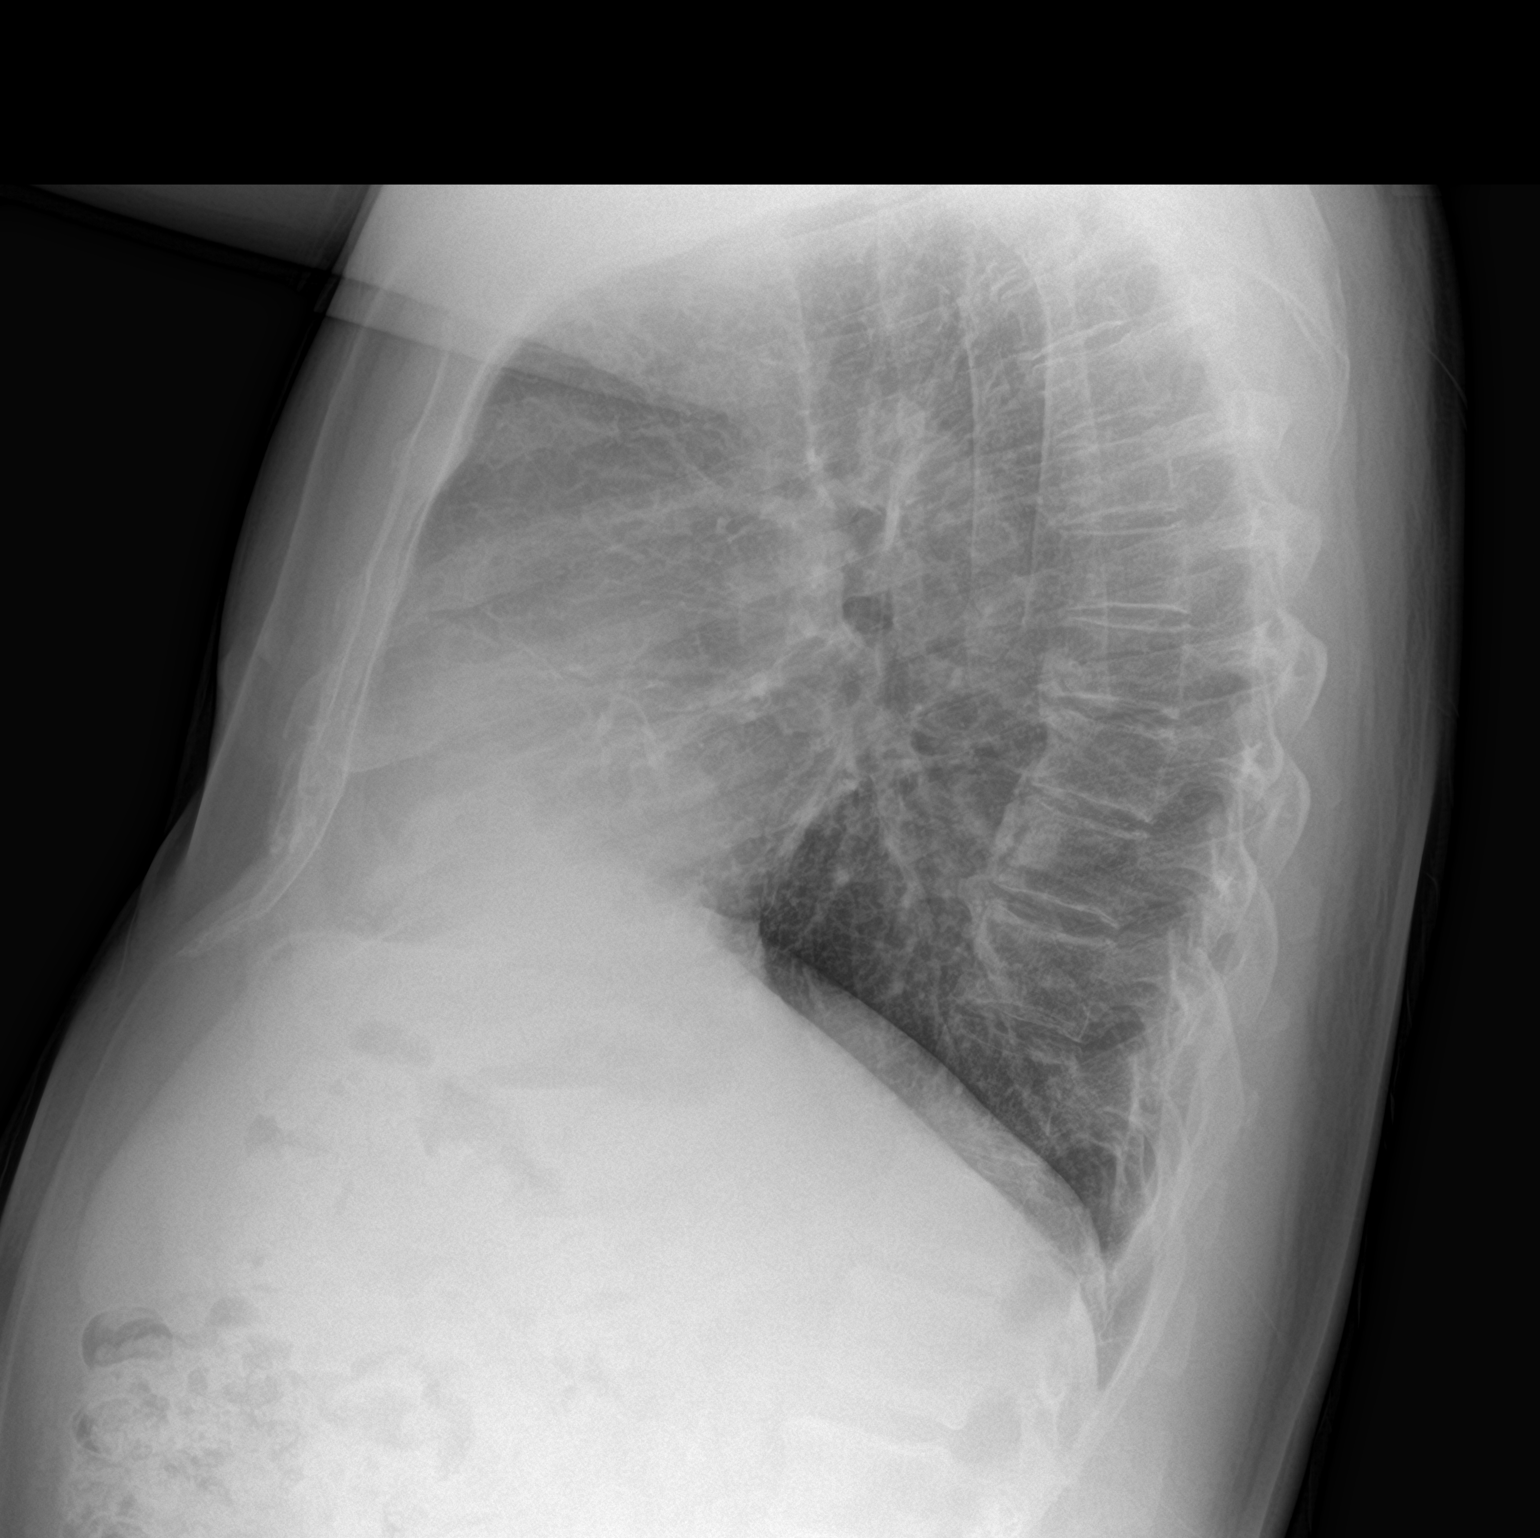

[2 of 2 positions shown; findings below may reference images not displayed]

FINDINGS: Cardiomediastinal silhouette within normal limits in size and
contour.

No central vascular congestion. No interlobular septal thickening.
Coarsened interstitial markings. No confluent airspace disease.
Degenerative changes of the spine. No acute displaced fracture
IMPRESSION: Negative for acute cardiopulmonary disease

## 2022-05-30 ENCOUNTER — Ambulatory Visit (INDEPENDENT_AMBULATORY_CARE_PROVIDER_SITE_OTHER): Payer: Medicare Other | Admitting: Physician Assistant

## 2022-05-30 VITALS — BP 122/76 | HR 86 | Ht 68.0 in | Wt 196.3 lb

## 2022-05-30 DIAGNOSIS — R300 Dysuria: Secondary | ICD-10-CM

## 2022-05-30 DIAGNOSIS — R3129 Other microscopic hematuria: Secondary | ICD-10-CM

## 2022-05-30 DIAGNOSIS — R350 Frequency of micturition: Secondary | ICD-10-CM

## 2022-05-30 LAB — MICROSCOPIC EXAMINATION
Bacteria, UA: NONE SEEN
Epithelial Cells (non renal): NONE SEEN /hpf (ref 0–10)
Renal Epithel, UA: NONE SEEN /hpf
WBC, UA: NONE SEEN /hpf (ref 0–5)

## 2022-05-30 LAB — URINALYSIS, ROUTINE W REFLEX MICROSCOPIC
Bilirubin, UA: NEGATIVE
Glucose, UA: NEGATIVE
Leukocytes,UA: NEGATIVE
Nitrite, UA: NEGATIVE
Specific Gravity, UA: 1.02 (ref 1.005–1.030)
Urobilinogen, Ur: 0.2 mg/dL (ref 0.2–1.0)
pH, UA: 6 (ref 5.0–7.5)

## 2022-05-30 LAB — BLADDER SCAN AMB NON-IMAGING: Scan Result: 0

## 2022-05-30 MED ORDER — PHENAZOPYRIDINE HCL 100 MG PO TABS: 100.0000 mg | ORAL_TABLET | Freq: Three times a day (TID) | ORAL | 0 refills | Status: DC | PRN

## 2022-05-30 MED ORDER — MIRABEGRON ER 25 MG PO TB24: 25.0000 mg | ORAL_TABLET | Freq: Every day | ORAL | 0 refills | Status: DC

## 2022-05-30 NOTE — Progress Notes (Signed)
post void residual =0mL 

## 2022-05-30 NOTE — Progress Notes (Signed)
Assessment: 1. Urine frequency - BLADDER SCAN AMB NON-IMAGING - Urinalysis, Routine w reflex microscopic  2. Microscopic hematuria  3. Dysuria    Plan: Differential and treatment options discussed with patient.  Myrbetriq samples given and patient advised on how to take them.  Pyridium prescribed for burning symptoms. Discussed the possibility of prostatitis and if symptoms not resolving with Myrbetriq recommended antibiotic therapy.  Recheck urinalysis for microscopic hematuria and Keep follow-up for 43-monthPSA and Eligard in September  Chief Complaint: No chief complaint on file.   HPI: Scott HOBBYis a 66y.o. male with history of prostate cancer and brachytherapy seed placement on 04/25/2021 who presents for evaluation of new complaint of urinary frequency, urgency, and terminal dysuria.  He denies fever, chills, nausea, vomiting, gross hematuria.  Symptoms are actually improving since onset.he continues Flomax as previously prescribed for postradiation urgency.  IPSS = 9 PVR = 0 mL UA = 11-30 RBCs, otherwise negative   03/12/22 [name] for for prostate cancer and BPH. IPSS 7 QOL 3 on flomax. Nocturia 1-2x. Urine stream strong. No straining to urinate. No hematuria or dysuria. No recent PSA. He is due for eligard today. Mild hot flashes. Mild fatigue. No other complaints today   Portions of the above documentation were copied from a prior visit for review purposes only.  Allergies: No Known Allergies  PMH: Past Medical History:  Diagnosis Date   GERD (gastroesophageal reflux disease)    History of gout    Hypertension    followed by pcp   (pt had nuclear study done 07-07-2013 in epic,  normal with no ishemia, ef 69%))   Nocturia    Prostate cancer ([facility] urologist--- McKenzie   dx 03/ 2022,  Stage T1c,  Gleason 3+4    PSH: Past Surgical History:  Procedure Laterality Date   COLONOSCOPY N/A 07/22/2013   Procedure: COLONOSCOPY;  Surgeon:  MJamesetta So MD;  [facility];  Service: Gastroenterology;  Laterality: N/A;   CYSTOSCOPY N/A 04/25/2021   Procedure: CYSTOSCOPY FLEXIBLE;  Surgeon: MAlexis Frock MD;  [facility]  Service: Urology;  Laterality: N/A;  NO SEEDS FOUND IN BLADDER   CYSTOSCOPY/RETROGRADE/URETEROSCOPY  07-31-2009  '@[facility]'$    RADIOACTIVE SEED IMPLANT N/A 04/25/2021   Procedure: RADIOACTIVE SEED IMPLANT/BRACHYTHERAPY IMPLANT;  Surgeon: MAlexis Frock MD;  [facility]  Service: Urology;  Laterality: N/A;  55 SEEDS IMPLANTED   SPACE OAR INSTILLATION N/A 04/25/2021   Procedure: SPACE OAR INSTILLATION;  Surgeon: MAlexis Frock MD;  [facility]  Service: Urology;  Laterality: N/A;    SH: Social History   Tobacco Use   Smoking status: Never   Smokeless tobacco: Never  Vaping Use   Vaping Use: Never used  Substance Use Topics   Alcohol use: No   Drug use: Never    ROS: Constitutional:  Negative for fever, chills, weight loss CV: Negative for chest pain Respiratory:  Negative for shortness of breath, wheezing, sleep apnea, frequent cough GI:  Negative for nausea, vomiting, bloody stool, GERD  PE: BP 122/76   Pulse 86   Ht '5\' 8"'$  (1.727 m)   Wt 196 lb 4.8 oz (89 kg)   BMI 29.85 kg/m  GENERAL APPEARANCE:  Well appearing, well developed, well nourished, NAD HEENT:  Atraumatic, normocephalic NECK:  Supple. Trachea midline ABDOMEN:  Soft, non-tender, no masses EXTREMITIES:  Moves all extremities well, without clubbing, cyanosis, or edema NEUROLOGIC:  Alert and oriented x 3, normal gait, CN II-XII grossly intact MENTAL STATUS:  appropriate BACK:  Non-tender to palpation, No CVAT SKIN:  Warm, dry, and intact   Results: Laboratory Data: Lab Results  Component Value Date   WBC 7.0 04/24/2021   HGB 16.8 04/24/2021   HCT 50.0 04/24/2021   MCV 90.3 04/24/2021   PLT 284 04/24/2021    Lab Results  Component  Value Date   CREATININE 1.24 04/24/2021    Urinalysis    Component Value Date/Time   APPEARANCEUR Clear 03/12/2022 1634   GLUCOSEU Negative 03/12/2022 1634   BILIRUBINUR Negative 03/12/2022 1634   PROTEINUR Negative 03/12/2022 1634   NITRITE Negative 03/12/2022 1634   LEUKOCYTESUR Negative 03/12/2022 1634    Lab Results  Component Value Date   LABMICR Comment 03/12/2022   WBCUA None seen 12/09/2021   LABEPIT None seen 12/09/2021   MUCUS Present 01/16/2021   BACTERIA None seen 12/09/2021    Pertinent Imaging:  Results for orders placed during the hospital encounter of 07/24/09  DG Abd 1 View  Narrative Clinical Data: Left ureteral calculus  ABDOMEN - 1 VIEW  Comparison: CT on 06/07/2009  Findings: Two adjacent calculi are now seen in the lower left pelvis in the expected region of the ureterovesicle junction. These measure 3-5 mm in size and have passed distally from the left intrarenal collecting system and UPJ region since prior CT.  IMPRESSION: Two small distal left ureteral calculi, which showed distal migration compared to prior CT.  Provider: Briscoe Burns  No results found for this or any previous visit.  No results found for this or any previous visit.  No results found for this or any previous visit.  No results found for this or any previous visit.  No results found for this or any previous visit.  No results found for this or any previous visit.  No results found for this or any previous visit.  No results found for this or any previous visit (from the past 24 hour(s)).

## 2022-09-12 ENCOUNTER — Other Ambulatory Visit: Payer: Medicare Other

## 2022-09-12 DIAGNOSIS — C61 Malignant neoplasm of prostate: Secondary | ICD-10-CM

## 2022-09-13 LAB — PSA: Prostate Specific Ag, Serum: <0.1 ng/mL (ref 0.0–4.0)

## 2022-09-17 ENCOUNTER — Ambulatory Visit (INDEPENDENT_AMBULATORY_CARE_PROVIDER_SITE_OTHER): Payer: Medicare Other | Admitting: Urology

## 2022-09-17 VITALS — BP 148/67 | Temp 87.0°F

## 2022-09-17 DIAGNOSIS — C61 Malignant neoplasm of prostate: Secondary | ICD-10-CM | POA: Diagnosis not present

## 2022-09-17 DIAGNOSIS — N401 Enlarged prostate with lower urinary tract symptoms: Secondary | ICD-10-CM

## 2022-09-17 DIAGNOSIS — R351 Nocturia: Secondary | ICD-10-CM | POA: Diagnosis not present

## 2022-09-17 DIAGNOSIS — N138 Other obstructive and reflux uropathy: Secondary | ICD-10-CM | POA: Diagnosis not present

## 2022-09-17 MED ORDER — LEUPROLIDE ACETATE (6 MONTH) 45 MG ~~LOC~~ KIT
45.0000 mg | PACK | Freq: Once | SUBCUTANEOUS | Status: AC
  Administered 2022-09-17: 45 mg via SUBCUTANEOUS

## 2022-09-17 MED ORDER — TAMSULOSIN HCL 0.4 MG PO CAPS: 0.4000 mg | ORAL_CAPSULE | ORAL | 3 refills | Status: DC

## 2022-09-17 MED ORDER — GEMTESA 75 MG PO TABS: 1.0000 | ORAL_TABLET | Freq: Every day | ORAL | 0 refills | Status: DC

## 2022-09-17 NOTE — Progress Notes (Signed)
Eligard SubQ Injection  ? ?Due to Prostate Cancer patient is present today for a Eligard Injection. ? ?Medication: Eligard 6 month ?Dose: 45 mg  ?Location: left  ? ?Patient tolerated well, no complications were noted ? ?Performed by: Deane Wattenbarger LPN ? ?

## 2022-09-17 NOTE — Progress Notes (Signed)
09/17/2022 2:52 PM   [name] 31-Oct-1956 [id number]  Referring provider: [facility] [place],  [place] [address]  Followup prostate cancer and BPH   HPI: [name] is a 66yo here for followup for prostate cancer and BPH with nocturia. IPSS 4 QOL 0 on flomax 0.'4mg'$  daily and mirabegron '25mg'$  daily. Urine stream strong. No straining to urinate. Urinary frequency every 2-3 hours. Nocturia 0-2x. PSA undetectable. He is have hot flashes 1-2 times per day which do not bother him. He is due for eligard today.    PMH: Past Medical History:  Diagnosis Date   GERD (gastroesophageal reflux disease)    History of gout    Hypertension    followed by pcp   (pt had nuclear study done 07-07-2013 in epic,  normal with no ishemia, ef 69%))   Nocturia    Prostate cancer [facility]) urologist--- Berry Gallacher   dx 03/ 2022,  Stage T1c,  Gleason 3+4    Surgical History: Past Surgical History:  Procedure Laterality Date   COLONOSCOPY N/A 07/22/2013   Procedure: COLONOSCOPY;  Surgeon: Jamesetta So, MD;  [facility];  Service: Gastroenterology;  Laterality: N/A;   CYSTOSCOPY N/A 04/25/2021   Procedure: CYSTOSCOPY FLEXIBLE;  Surgeon: Alexis Frock, MD;  [facility];  Service: Urology;  Laterality: N/A;  NO SEEDS FOUND IN BLADDER   CYSTOSCOPY/RETROGRADE/URETEROSCOPY  07-31-2009  '@[facility]'$    RADIOACTIVE SEED IMPLANT N/A 04/25/2021   Procedure: RADIOACTIVE SEED IMPLANT/BRACHYTHERAPY IMPLANT;  Surgeon: Alexis Frock, MD;  [facility];  Service: Urology;  Laterality: N/A;  27  SEEDS IMPLANTED   SPACE OAR INSTILLATION N/A 04/25/2021   Procedure: SPACE OAR INSTILLATION;  Surgeon: Alexis Frock, MD;  Location: [facility];  Service: Urology;  Laterality: N/A;    Home Medications:  Allergies as of 09/17/2022   No Known Allergies      Medication List        Accurate as of September 17, 2022   2:52 PM. If you have any questions, ask your nurse or doctor.          aspirin 81 MG chewable tablet Chew 81 mg by mouth daily.   calcium carbonate 500 MG chewable tablet Commonly known as: TUMS - dosed in mg elemental calcium Chew 1 tablet by mouth as needed for indigestion or heartburn.   FISH OIL PO Take by mouth daily.   indomethacin 50 MG capsule Commonly known as: INDOCIN Take 50 mg by mouth 3 (three) times daily as needed.   levothyroxine 25 MCG tablet Commonly known as: SYNTHROID Take 25 mcg by mouth daily before breakfast.   lisinopril 10 MG tablet Commonly known as: ZESTRIL Take 5 mg by mouth daily.   lovastatin 20 MG tablet Commonly known as: MEVACOR Take 20 mg by mouth daily.   megestrol 20 MG tablet Commonly known as: MEGACE Take 1 tablet (20 mg total) by mouth 2 (two) times daily as needed.   mirabegron ER 25 MG Tb24 tablet Commonly known as: MYRBETRIQ Take 1 tablet (25 mg total) by mouth daily.   phenazopyridine 100 MG tablet Commonly known as: Pyridium Take 1 tablet (100 mg total) by mouth 3 (three) times daily as needed for pain.   tamsulosin 0.4 MG Caps capsule Commonly known as: FLOMAX Take 1 capsule (0.4 mg total) by mouth daily after supper. For urinary urgency / weak stream after prostate radiation What changed: when to take this  Allergies: No Known Allergies  Family History: Family History  Problem Relation Age of Onset   Breast cancer Sister    Cervical cancer Sister    Prostate cancer Neg Hx    Colon cancer Neg Hx    Pancreatic cancer Neg Hx     Social History:  reports that he has never smoked. He has never used smokeless tobacco. He reports that he does not drink alcohol and does not use drugs.  ROS: All other review of systems were reviewed and are negative except what is noted above in HPI  Physical Exam: BP (!) 148/67   Temp (!) 87 F (30.6 C)   Constitutional:  Alert and oriented, No acute  distress. HEENT: [place] AT, moist mucus membranes.  Trachea midline, no masses. Cardiovascular: No clubbing, cyanosis, or edema. Respiratory: Normal respiratory effort, no increased work of breathing. GI: Abdomen is soft, nontender, nondistended, no abdominal masses GU: No CVA tenderness.  Lymph: No cervical or inguinal lymphadenopathy. Skin: No rashes, bruises or suspicious lesions. Neurologic: Grossly intact, no focal deficits, moving all 4 extremities. Psychiatric: Normal mood and affect.  Laboratory Data: Lab Results  Component Value Date   WBC 7.0 04/24/2021   HGB 16.8 04/24/2021   HCT 50.0 04/24/2021   MCV 90.3 04/24/2021   PLT 284 04/24/2021    Lab Results  Component Value Date   CREATININE 1.24 04/24/2021    No results found for: "PSA"  No results found for: "TESTOSTERONE"  No results found for: "HGBA1C"  Urinalysis    Component Value Date/Time   APPEARANCEUR Clear 05/30/2022 1116   GLUCOSEU Negative 05/30/2022 1116   BILIRUBINUR Negative 05/30/2022 1116   PROTEINUR 1+ (A) 05/30/2022 1116   NITRITE Negative 05/30/2022 1116   LEUKOCYTESUR Negative 05/30/2022 1116    Lab Results  Component Value Date   LABMICR See below: 05/30/2022   WBCUA None seen 05/30/2022   LABEPIT None seen 05/30/2022   MUCUS Present 05/30/2022   BACTERIA None seen 05/30/2022    Pertinent Imaging:  Results for orders placed during the hospital encounter of 07/24/09  DG Abd 1 View  Narrative Clinical Data: Left ureteral calculus  ABDOMEN - 1 VIEW  Comparison: CT on 06/07/2009  Findings: Two adjacent calculi are now seen in the lower left pelvis in the expected region of the ureterovesicle junction. These measure 3-5 mm in size and have passed distally from the left intrarenal collecting system and UPJ region since prior CT.  IMPRESSION: Two small distal left ureteral calculi, which showed distal migration compared to prior CT.  Provider: Briscoe Burns  No results  found for this or any previous visit.  No results found for this or any previous visit.  No results found for this or any previous visit.  No results found for this or any previous visit.  No results found for this or any previous visit.  No results found for this or any previous visit.  No results found for this or any previous visit.   Assessment & Plan:    1. Prostate cancer ([place]) -RTC 6 months with PSA - Urinalysis, Routine w reflex microscopic - leuprolide (6 Month) (ELIGARD) injection 45 mg  2. Benign prostatic hyperplasia with urinary obstruction -Continue flomax 0.'4mg'$  daily  3. Nocturia -We will trial gemtesa '75mg'$  daily. Continue flomax 0.'4mg'$  daily    No follow-ups on file.  Nicolette Bang, MD  [facility] [place]

## 2022-09-19 LAB — URINALYSIS, ROUTINE W REFLEX MICROSCOPIC
Bilirubin, UA: NEGATIVE
Glucose, UA: NEGATIVE
Ketones, UA: NEGATIVE
Leukocytes,UA: NEGATIVE
Nitrite, UA: NEGATIVE
Protein,UA: NEGATIVE
RBC, UA: NEGATIVE
Specific Gravity, UA: 1.02 (ref 1.005–1.030)
Urobilinogen, Ur: 0.2 mg/dL (ref 0.2–1.0)
pH, UA: 6 (ref 5.0–7.5)

## 2022-09-24 ENCOUNTER — Encounter: Payer: Self-pay | Admitting: Urology

## 2022-09-24 NOTE — Patient Instructions (Signed)

## 2023-03-11 ENCOUNTER — Other Ambulatory Visit: Payer: Self-pay

## 2023-03-11 ENCOUNTER — Ambulatory Visit (INDEPENDENT_AMBULATORY_CARE_PROVIDER_SITE_OTHER): Payer: Medicare Other | Admitting: Urology

## 2023-03-11 ENCOUNTER — Encounter: Payer: Self-pay | Admitting: Urology

## 2023-03-11 VITALS — BP 146/69 | HR 69

## 2023-03-11 DIAGNOSIS — N138 Other obstructive and reflux uropathy: Secondary | ICD-10-CM

## 2023-03-11 DIAGNOSIS — C61 Malignant neoplasm of prostate: Secondary | ICD-10-CM

## 2023-03-11 DIAGNOSIS — N401 Enlarged prostate with lower urinary tract symptoms: Secondary | ICD-10-CM

## 2023-03-11 DIAGNOSIS — R351 Nocturia: Secondary | ICD-10-CM | POA: Diagnosis not present

## 2023-03-11 LAB — URINALYSIS, ROUTINE W REFLEX MICROSCOPIC
Bilirubin, UA: NEGATIVE
Glucose, UA: NEGATIVE
Ketones, UA: NEGATIVE
Leukocytes,UA: NEGATIVE
Nitrite, UA: NEGATIVE
Protein,UA: NEGATIVE
RBC, UA: NEGATIVE
Specific Gravity, UA: 1.025 (ref 1.005–1.030)
Urobilinogen, Ur: 0.2 mg/dL (ref 0.2–1.0)
pH, UA: 5.5 (ref 5.0–7.5)

## 2023-03-11 MED ORDER — ALFUZOSIN HCL ER 10 MG PO TB24: 10.0000 mg | ORAL_TABLET | Freq: Every day | ORAL | 11 refills | Status: DC

## 2023-03-11 NOTE — Progress Notes (Signed)
03/11/2023 8:50 AM   Festus Holts [birth year] [id number]  Referring provider: [facility],  [place] [address]  Followup prostate cancer and nocturia   HPI: Mr Newborn is a 67yo here for followup for nocturia and prostate cancer. NO recent PSA. IPSS 11 QOL 3 on flomax 0.'4mg'$  daily. Nocturia 2-3x. Urine stream strong. No straining to urinate. He has urinary urgency. He has rare urge incontinence. Urinary frequency every 3 hours.   PMH: Past Medical History:  Diagnosis Date   GERD (gastroesophageal reflux disease)    History of gout    Hypertension    followed by pcp   (pt had nuclear study done 07-07-2013 in epic,  normal with no ishemia, ef 69%))   Nocturia    Prostate cancer [facility]) urologist--- Jarel Cuadra   dx 03/ 2022,  Stage T1c,  Gleason 3+4    Surgical History: Past Surgical History:  Procedure Laterality Date   COLONOSCOPY N/A 07/22/2013   Procedure: COLONOSCOPY;  Surgeon: Jamesetta So, MD;  [facility];  Service: Gastroenterology;  Laterality: N/A;   CYSTOSCOPY N/A 04/25/2021   Procedure: CYSTOSCOPY FLEXIBLE;  Surgeon: Alexis Frock, MD;  Location: [facility];  Service: Urology;  Laterality: N/A;  NO SEEDS FOUND IN BLADDER   CYSTOSCOPY/RETROGRADE/URETEROSCOPY  07-31-2009  '@[facility]'$    RADIOACTIVE SEED IMPLANT N/A 04/25/2021   Procedure: RADIOACTIVE SEED IMPLANT/BRACHYTHERAPY IMPLANT;  Surgeon: Alexis Frock, MD;  Location: [facility];  Service: Urology;  Laterality: N/A;  28  SEEDS IMPLANTED   SPACE OAR INSTILLATION N/A 04/25/2021   Procedure: SPACE OAR INSTILLATION;  Surgeon: Alexis Frock, MD;  Location: [facility];  Service: Urology;  Laterality: N/A;    Home Medications:  Allergies as of 03/11/2023   No Known Allergies      Medication List        Accurate as of March 11, 2023  8:50 AM. If you have any questions, ask your nurse or doctor.           aspirin 81 MG chewable tablet Chew 81 mg by mouth daily.   calcium carbonate 500 MG chewable tablet Commonly known as: TUMS - dosed in mg elemental calcium Chew 1 tablet by mouth as needed for indigestion or heartburn.   fenofibrate micronized 200 MG capsule Commonly known as: LOFIBRA SMARTSIG:1 Capsule(s) By Mouth   FISH OIL PO Take by mouth daily.   Gemtesa 75 MG Tabs Generic drug: Vibegron Take 1 capsule by mouth daily.   indomethacin 50 MG capsule Commonly known as: INDOCIN Take 50 mg by mouth 3 (three) times daily as needed.   levothyroxine 25 MCG tablet Commonly known as: SYNTHROID Take 25 mcg by mouth daily before breakfast.   lisinopril 10 MG tablet Commonly known as: ZESTRIL Take 5 mg by mouth daily.   lovastatin 20 MG tablet Commonly known as: MEVACOR Take 20 mg by mouth daily.   megestrol 20 MG tablet Commonly known as: MEGACE Take 1 tablet (20 mg total) by mouth 2 (two) times daily as needed.   mirabegron ER 25 MG Tb24 tablet Commonly known as: MYRBETRIQ Take 1 tablet (25 mg total) by mouth daily.   phenazopyridine 100 MG tablet Commonly known as: Pyridium Take 1 tablet (100 mg total) by mouth 3 (three) times daily as needed for pain.   tamsulosin 0.4 MG Caps capsule Commonly known as: FLOMAX Take 1 capsule (0.4 mg total) by mouth every other day. For urinary  urgency / weak stream after prostate radiation        Allergies: No Known Allergies  Family History: Family History  Problem Relation Age of Onset   Breast cancer Sister    Cervical cancer Sister    Prostate cancer Neg Hx    Colon cancer Neg Hx    Pancreatic cancer Neg Hx     Social History:  reports that he has never smoked. He has never used smokeless tobacco. He reports that he does not drink alcohol and does not use drugs.  ROS: All other review of systems were reviewed and are negative except what is noted above in HPI  Physical Exam: BP (!) 146/69   Pulse 69    Constitutional:  Alert and oriented, No acute distress. HEENT: [place] AT, moist mucus membranes.  Trachea midline, no masses. Cardiovascular: No clubbing, cyanosis, or edema. Respiratory: Normal respiratory effort, no increased work of breathing. GI: Abdomen is soft, nontender, nondistended, no abdominal masses GU: No CVA tenderness.  Lymph: No cervical or inguinal lymphadenopathy. Skin: No rashes, bruises or suspicious lesions. Neurologic: Grossly intact, no focal deficits, moving all 4 extremities. Psychiatric: Normal mood and affect.  Laboratory Data: Lab Results  Component Value Date   WBC 7.0 04/24/2021   HGB 16.8 04/24/2021   HCT 50.0 04/24/2021   MCV 90.3 04/24/2021   PLT 284 04/24/2021    Lab Results  Component Value Date   CREATININE 1.24 04/24/2021    No results found for: "PSA"  No results found for: "TESTOSTERONE"  No results found for: "HGBA1C"  Urinalysis    Component Value Date/Time   APPEARANCEUR Clear 09/17/2022 1510   GLUCOSEU Negative 09/17/2022 1510   BILIRUBINUR Negative 09/17/2022 1510   PROTEINUR Negative 09/17/2022 1510   NITRITE Negative 09/17/2022 1510   LEUKOCYTESUR Negative 09/17/2022 1510    Lab Results  Component Value Date   LABMICR Comment 09/17/2022   WBCUA None seen 05/30/2022   LABEPIT None seen 05/30/2022   MUCUS Present 05/30/2022   BACTERIA None seen 05/30/2022    Pertinent Imaging:  Results for orders placed during the hospital encounter of 07/24/09  DG Abd 1 View  Narrative Clinical Data: Left ureteral calculus  ABDOMEN - 1 VIEW  Comparison: CT on 06/07/2009  Findings: Two adjacent calculi are now seen in the lower left pelvis in the expected region of the ureterovesicle junction. These measure 3-5 mm in size and have passed distally from the left intrarenal collecting system and UPJ region since prior CT.  IMPRESSION: Two small distal left ureteral calculi, which showed distal migration compared to prior  CT.  Provider: Briscoe Burns  No results found for this or any previous visit.  No results found for this or any previous visit.  No results found for this or any previous visit.  No results found for this or any previous visit.  No valid procedures specified. No results found for this or any previous visit.  No results found for this or any previous visit.   Assessment & Plan:    1. Prostate cancer ([place]) PSa today -followup 6 months with PSA - Urinalysis, Routine w reflex microscopic  2. Benign prostatic hyperplasia with urinary obstruction -we will trial uroxatral '10mg'$    3. Nocturia -uroxatral '10mg'$  qhs   No follow-ups on file.  Nicolette Bang, MD  [facility] [place]

## 2023-03-11 NOTE — Patient Instructions (Signed)

## 2023-03-12 LAB — PSA: Prostate Specific Ag, Serum: <0.1 ng/mL (ref 0.0–4.0)

## 2023-09-03 ENCOUNTER — Encounter: Payer: Self-pay | Admitting: *Deleted

## 2023-09-07 ENCOUNTER — Encounter: Payer: Self-pay | Admitting: Urology

## 2023-09-07 ENCOUNTER — Ambulatory Visit (INDEPENDENT_AMBULATORY_CARE_PROVIDER_SITE_OTHER): Payer: Medicare Other | Admitting: Urology

## 2023-09-07 VITALS — BP 146/68 | HR 82

## 2023-09-07 DIAGNOSIS — N401 Enlarged prostate with lower urinary tract symptoms: Secondary | ICD-10-CM

## 2023-09-07 DIAGNOSIS — R350 Frequency of micturition: Secondary | ICD-10-CM | POA: Diagnosis not present

## 2023-09-07 DIAGNOSIS — C61 Malignant neoplasm of prostate: Secondary | ICD-10-CM

## 2023-09-07 DIAGNOSIS — Z8546 Personal history of malignant neoplasm of prostate: Secondary | ICD-10-CM

## 2023-09-07 DIAGNOSIS — N138 Other obstructive and reflux uropathy: Secondary | ICD-10-CM | POA: Diagnosis not present

## 2023-09-07 LAB — URINALYSIS, ROUTINE W REFLEX MICROSCOPIC
Bilirubin, UA: NEGATIVE
Glucose, UA: NEGATIVE
Ketones, UA: NEGATIVE
Leukocytes,UA: NEGATIVE
Nitrite, UA: NEGATIVE
Protein,UA: NEGATIVE
RBC, UA: NEGATIVE
Specific Gravity, UA: 1.03 (ref 1.005–1.030)
Urobilinogen, Ur: 0.2 mg/dL (ref 0.2–1.0)
pH, UA: 5.5 (ref 5.0–7.5)

## 2023-09-07 MED ORDER — ALFUZOSIN HCL ER 10 MG PO TB24: 10.0000 mg | ORAL_TABLET | Freq: Every day | ORAL | 11 refills | Status: DC

## 2023-09-07 NOTE — Patient Instructions (Signed)

## 2023-09-07 NOTE — Progress Notes (Unsigned)
09/07/2023 8:45 AM   [name] [birth year] [id number]  Referring provider: The [facility] [address] [place],  [place] [address]  No chief complaint on file.   HPI: Last PSA 02/2023 was undetectable. IPSS 3 QOL 1 on uroxatral 10mg  at bedtime. Nocturia 1x. No straining to urinate. He has mild hot flashes 1-2 times per day.    PMH: Past Medical History:  Diagnosis Date   GERD (gastroesophageal reflux disease)    History of gout    Hypertension    followed by pcp   (pt had nuclear study done 07-07-2013 in epic,  normal with no ishemia, ef 69%))   Nocturia    Prostate cancer [facility]) urologist--- Cerys Winget   dx 03/ 2022,  Stage T1c,  Gleason 3+4    Surgical History: Past Surgical History:  Procedure Laterality Date   COLONOSCOPY N/A 07/22/2013   Procedure: COLONOSCOPY;  Surgeon: Dalia Heading, MD;  [facility];  Service: Gastroenterology;  Laterality: N/A;   CYSTOSCOPY N/A 04/25/2021   Procedure: CYSTOSCOPY FLEXIBLE;  Surgeon: Sebastian Ache, MD;  Location: [facility];  Service: Urology;  Laterality: N/A;  NO SEEDS FOUND IN BLADDER   CYSTOSCOPY/RETROGRADE/URETEROSCOPY  07-31-2009  @[facility]    RADIOACTIVE SEED IMPLANT N/A 04/25/2021   Procedure: RADIOACTIVE SEED IMPLANT/BRACHYTHERAPY IMPLANT;  Surgeon: Sebastian Ache, MD;  Location: [facility];  Service: Urology;  Laterality: N/A;  52  SEEDS IMPLANTED   SPACE OAR INSTILLATION N/A 04/25/2021   Procedure: SPACE OAR INSTILLATION;  Surgeon: Sebastian Ache, MD;  Location: [facility];  Service: Urology;  Laterality: N/A;    Home Medications:  Allergies as of 09/07/2023   No Known Allergies      Medication List        Accurate as of September 07, 2023  8:45 AM. If you have any questions, ask your nurse or doctor.          alfuzosin 10 MG 24 hr tablet Commonly known as: UROXATRAL Take 1 tablet (10 mg total) by mouth at bedtime.   aspirin 81  MG chewable tablet Chew 81 mg by mouth daily.   calcium carbonate 500 MG chewable tablet Commonly known as: TUMS - dosed in mg elemental calcium Chew 1 tablet by mouth as needed for indigestion or heartburn.   fenofibrate micronized 200 MG capsule Commonly known as: LOFIBRA SMARTSIG:1 Capsule(s) By Mouth   FISH OIL PO Take by mouth daily.   Gemtesa 75 MG Tabs Generic drug: Vibegron Take 1 capsule by mouth daily.   indomethacin 50 MG capsule Commonly known as: INDOCIN Take 50 mg by mouth 3 (three) times daily as needed.   levothyroxine 25 MCG tablet Commonly known as: SYNTHROID Take 25 mcg by mouth daily before breakfast.   lisinopril 10 MG tablet Commonly known as: ZESTRIL Take 5 mg by mouth daily.   lovastatin 20 MG tablet Commonly known as: MEVACOR Take 20 mg by mouth daily.   megestrol 20 MG tablet Commonly known as: MEGACE Take 1 tablet (20 mg total) by mouth 2 (two) times daily as needed.   mirabegron ER 25 MG Tb24 tablet Commonly known as: MYRBETRIQ Take 1 tablet (25 mg total) by mouth daily.   phenazopyridine 100 MG tablet Commonly known as: Pyridium Take 1 tablet (100 mg total) by mouth 3 (three) times daily as needed for pain.   tamsulosin 0.4 MG Caps capsule Commonly known as: FLOMAX Take 1 capsule (0.4 mg total) by mouth  every other day. For urinary urgency / weak stream after prostate radiation        Allergies: No Known Allergies  Family History: Family History  Problem Relation Age of Onset   Breast cancer Sister    Cervical cancer Sister    Prostate cancer Neg Hx    Colon cancer Neg Hx    Pancreatic cancer Neg Hx     Social History:  reports that he has never smoked. He has never used smokeless tobacco. He reports that he does not drink alcohol and does not use drugs.  ROS: All other review of systems were reviewed and are negative except what is noted above in HPI  Physical Exam: BP (!) 146/68   Pulse 82   Constitutional:   Alert and oriented, No acute distress. HEENT: [place] AT, moist mucus membranes.  Trachea midline, no masses. Cardiovascular: No clubbing, cyanosis, or edema. Respiratory: Normal respiratory effort, no increased work of breathing. GI: Abdomen is soft, nontender, nondistended, no abdominal masses GU: No CVA tenderness.  Lymph: No cervical or inguinal lymphadenopathy. Skin: No rashes, bruises or suspicious lesions. Neurologic: Grossly intact, no focal deficits, moving all 4 extremities. Psychiatric: Normal mood and affect.  Laboratory Data: Lab Results  Component Value Date   WBC 7.0 04/24/2021   HGB 16.8 04/24/2021   HCT 50.0 04/24/2021   MCV 90.3 04/24/2021   PLT 284 04/24/2021    Lab Results  Component Value Date   CREATININE 1.24 04/24/2021    No results found for: "PSA"  No results found for: "TESTOSTERONE"  No results found for: "HGBA1C"  Urinalysis    Component Value Date/Time   APPEARANCEUR Clear 03/11/2023 0829   GLUCOSEU Negative 03/11/2023 0829   BILIRUBINUR Negative 03/11/2023 0829   PROTEINUR Negative 03/11/2023 0829   NITRITE Negative 03/11/2023 0829   LEUKOCYTESUR Negative 03/11/2023 0829    Lab Results  Component Value Date   LABMICR Comment 03/11/2023   WBCUA None seen 05/30/2022   LABEPIT None seen 05/30/2022   MUCUS Present 05/30/2022   BACTERIA None seen 05/30/2022    Pertinent Imaging: *** Results for orders placed during the hospital encounter of 07/24/09  DG Abd 1 View  Narrative Clinical Data: Left ureteral calculus  ABDOMEN - 1 VIEW  Comparison: CT on 06/07/2009  Findings: Two adjacent calculi are now seen in the lower left pelvis in the expected region of the ureterovesicle junction. These measure 3-5 mm in size and have passed distally from the left intrarenal collecting system and UPJ region since prior CT.  IMPRESSION: Two small distal left ureteral calculi, which showed distal migration compared to prior CT.  Provider:  Kyra Searles  No results found for this or any previous visit.  No results found for this or any previous visit.  No results found for this or any previous visit.  No results found for this or any previous visit.  No valid procedures specified. No results found for this or any previous visit.  No results found for this or any previous visit.   Assessment & Plan:    1. Prostate cancer (HCC) -PSa today -Followup 6 months with PSA - Urinalysis, Routine w reflex microscopic  2. Benign prostatic hyperplasia with urinary obstruction -Continue uroxatral 10mg  qhs  3. Urine frequency -Continue uroxatral 10mg  QHS   No follow-ups on file.  Wilkie Aye, MD  [facility] [place]

## 2023-09-08 LAB — PSA: Prostate Specific Ag, Serum: <0.1 ng/mL (ref 0.0–4.0)

## 2024-03-03 ENCOUNTER — Encounter (INDEPENDENT_AMBULATORY_CARE_PROVIDER_SITE_OTHER): Payer: Self-pay | Admitting: *Deleted

## 2024-03-07 ENCOUNTER — Other Ambulatory Visit: Payer: Medicare Other

## 2024-03-07 DIAGNOSIS — C61 Malignant neoplasm of prostate: Secondary | ICD-10-CM

## 2024-03-08 LAB — PSA: Prostate Specific Ag, Serum: <0.1 ng/mL (ref 0.0–4.0)

## 2024-03-16 ENCOUNTER — Ambulatory Visit (INDEPENDENT_AMBULATORY_CARE_PROVIDER_SITE_OTHER): Payer: Medicare Other | Admitting: Urology

## 2024-03-16 ENCOUNTER — Encounter: Payer: Self-pay | Admitting: Urology

## 2024-03-16 VITALS — BP 114/63 | HR 79

## 2024-03-16 DIAGNOSIS — N401 Enlarged prostate with lower urinary tract symptoms: Secondary | ICD-10-CM

## 2024-03-16 DIAGNOSIS — C61 Malignant neoplasm of prostate: Secondary | ICD-10-CM | POA: Diagnosis not present

## 2024-03-16 DIAGNOSIS — R351 Nocturia: Secondary | ICD-10-CM

## 2024-03-16 DIAGNOSIS — N138 Other obstructive and reflux uropathy: Secondary | ICD-10-CM

## 2024-03-16 LAB — URINALYSIS, ROUTINE W REFLEX MICROSCOPIC
Bilirubin, UA: NEGATIVE
Glucose, UA: NEGATIVE
Ketones, UA: NEGATIVE
Leukocytes,UA: NEGATIVE
Nitrite, UA: NEGATIVE
Protein,UA: NEGATIVE
RBC, UA: NEGATIVE
Specific Gravity, UA: 1.025 (ref 1.005–1.030)
Urobilinogen, Ur: 0.2 mg/dL (ref 0.2–1.0)
pH, UA: 5.5 (ref 5.0–7.5)

## 2024-03-16 MED ORDER — ALFUZOSIN HCL ER 10 MG PO TB24: 10.0000 mg | ORAL_TABLET | Freq: Every day | ORAL | 11 refills | Status: AC

## 2024-03-16 NOTE — Patient Instructions (Signed)

## 2024-03-16 NOTE — Progress Notes (Signed)
 03/16/2024 9:08 AM   [name] 1956-04-07 [id number]  Referring provider: Karl Bales, NP [address] [place],  [place] [address]  Followup prostate cancer  HPI: [name] is a 18JY here for followup for prostate cancer and BPH. PSA remains undetectable. IPSS 2 QOL 1 on uroxatral 10mg  at bedtime. NOcturi 0-1x. Urine stream strong. No straining to urinate. No other complaints today   PMH: Past Medical History:  Diagnosis Date   GERD (gastroesophageal reflux disease)    History of gout    Hypertension    followed by pcp   (pt had nuclear study done 07-07-2013 in epic,  normal with no ishemia, ef 69%))   Nocturia    Prostate cancer [facility]) urologist--- Tamarick Kovalcik   dx 03/ 2022,  Stage T1c,  Gleason 3+4    Surgical History: Past Surgical History:  Procedure Laterality Date   COLONOSCOPY N/A 07/22/2013   Procedure: COLONOSCOPY;  Surgeon: Dalia Heading, MD;  [facility];  Service: Gastroenterology;  Laterality: N/A;   CYSTOSCOPY N/A 04/25/2021   Procedure: CYSTOSCOPY FLEXIBLE;  Surgeon: Sebastian Ache, MD;  Location: [facility];  Service: Urology;  Laterality: N/A;  NO SEEDS FOUND IN BLADDER   CYSTOSCOPY/RETROGRADE/URETEROSCOPY  07-31-2009  @[facility]    RADIOACTIVE SEED IMPLANT N/A 04/25/2021   Procedure: RADIOACTIVE SEED IMPLANT/BRACHYTHERAPY IMPLANT;  Surgeon: Sebastian Ache, MD;  Location: [facility];  Service: Urology;  Laterality: N/A;  53  SEEDS IMPLANTED   SPACE OAR INSTILLATION N/A 04/25/2021   Procedure: SPACE OAR INSTILLATION;  Surgeon: Sebastian Ache, MD;  Location: [facility];  Service: Urology;  Laterality: N/A;    Home Medications:  Allergies as of 03/16/2024   No Known Allergies      Medication List        Accurate as of March 16, 2024  9:08 AM. If you have any questions, ask your nurse or doctor.          alfuzosin 10 MG 24 hr tablet Commonly known as: UROXATRAL Take 1 tablet  (10 mg total) by mouth at bedtime.   aspirin 81 MG chewable tablet Chew 81 mg by mouth daily.   calcium carbonate 500 MG chewable tablet Commonly known as: TUMS - dosed in mg elemental calcium Chew 1 tablet by mouth as needed for indigestion or heartburn.   fenofibrate micronized 200 MG capsule Commonly known as: LOFIBRA SMARTSIG:1 Capsule(s) By Mouth   FISH OIL PO Take by mouth daily.   Gemtesa 75 MG Tabs Generic drug: Vibegron Take 1 capsule by mouth daily.   indomethacin 50 MG capsule Commonly known as: INDOCIN Take 50 mg by mouth 3 (three) times daily as needed.   levothyroxine 25 MCG tablet Commonly known as: SYNTHROID Take 25 mcg by mouth daily before breakfast.   lisinopril 10 MG tablet Commonly known as: ZESTRIL Take 5 mg by mouth daily.   lovastatin 20 MG tablet Commonly known as: MEVACOR Take 20 mg by mouth daily.   megestrol 20 MG tablet Commonly known as: MEGACE Take 1 tablet (20 mg total) by mouth 2 (two) times daily as needed.   mirabegron ER 25 MG Tb24 tablet Commonly known as: MYRBETRIQ Take 1 tablet (25 mg total) by mouth daily.   phenazopyridine 100 MG tablet Commonly known as: Pyridium Take 1 tablet (100 mg total) by mouth 3 (three) times daily as needed for pain.   tamsulosin 0.4 MG Caps capsule Commonly known as: FLOMAX Take 1 capsule (  0.4 mg total) by mouth every other day. For urinary urgency / weak stream after prostate radiation        Allergies: No Known Allergies  Family History: Family History  Problem Relation Age of Onset   Breast cancer Sister    Cervical cancer Sister    Prostate cancer Neg Hx    Colon cancer Neg Hx    Pancreatic cancer Neg Hx     Social History:  reports that he has never smoked. He has never used smokeless tobacco. He reports that he does not drink alcohol and does not use drugs.  ROS: All other review of systems were reviewed and are negative except what is noted above in HPI  Physical  Exam: BP 114/63   Pulse 79   Constitutional:  Alert and oriented, No acute distress. HEENT: [place] AT, moist mucus membranes.  Trachea midline, no masses. Cardiovascular: No clubbing, cyanosis, or edema. Respiratory: Normal respiratory effort, no increased work of breathing. GI: Abdomen is soft, nontender, nondistended, no abdominal masses GU: No CVA tenderness.  Lymph: No cervical or inguinal lymphadenopathy. Skin: No rashes, bruises or suspicious lesions. Neurologic: Grossly intact, no focal deficits, moving all 4 extremities. Psychiatric: Normal mood and affect.  Laboratory Data: Lab Results  Component Value Date   WBC 7.0 04/24/2021   HGB 16.8 04/24/2021   HCT 50.0 04/24/2021   MCV 90.3 04/24/2021   PLT 284 04/24/2021    Lab Results  Component Value Date   CREATININE 1.24 04/24/2021    No results found for: "PSA"  No results found for: "TESTOSTERONE"  No results found for: "HGBA1C"  Urinalysis    Component Value Date/Time   APPEARANCEUR Clear 09/07/2023 0927   GLUCOSEU Negative 09/07/2023 0927   BILIRUBINUR Negative 09/07/2023 0927   PROTEINUR Negative 09/07/2023 0927   NITRITE Negative 09/07/2023 0927   LEUKOCYTESUR Negative 09/07/2023 0927    Lab Results  Component Value Date   LABMICR Comment 09/07/2023   WBCUA None seen 05/30/2022   LABEPIT None seen 05/30/2022   MUCUS Present 05/30/2022   BACTERIA None seen 05/30/2022    Pertinent Imaging:  Results for orders placed during the hospital encounter of 07/24/09  DG Abd 1 View  Narrative Clinical Data: Left ureteral calculus  ABDOMEN - 1 VIEW  Comparison: CT on 06/07/2009  Findings: Two adjacent calculi are now seen in the lower left pelvis in the expected region of the ureterovesicle junction. These measure 3-5 mm in size and have passed distally from the left intrarenal collecting system and UPJ region since prior CT.  IMPRESSION: Two small distal left ureteral calculi, which showed  distal migration compared to prior CT.  Provider: Kyra Searles  No results found for this or any previous visit.  No results found for this or any previous visit.  No results found for this or any previous visit.  No results found for this or any previous visit.  No results found for this or any previous visit.  No results found for this or any previous visit.  No results found for this or any previous visit.   Assessment & Plan:    1. Prostate cancer (HCC) (Primary) Followup 1 year with PSA - Urinalysis, Routine w reflex microscopic  2. Benign prostatic hyperplasia with urinary obstruction -Uroxatral 10mg  every other day  3. Nocturia -Uroxatral 10mg  every other day.    No follow-ups on file.  Wilkie Aye, MD  [facility] [place]

## 2024-03-17 ENCOUNTER — Encounter: Payer: Self-pay | Admitting: *Deleted

## 2024-04-25 ENCOUNTER — Telehealth: Payer: Self-pay | Admitting: *Deleted

## 2024-04-25 NOTE — Telephone Encounter (Signed)
  Procedure: COLONOSCOPY  Height: 5'8 Weight: 195LBS        Have you had a colonoscopy before?  06/2013, Dr. Larrie Po  Do you have family history of colon cancer?  no  Do you have a family history of polyps? no  Previous colonoscopy with polyps removed? no  Do you have a history colorectal cancer?   no  Are you diabetic?  no  Do you have a prosthetic or mechanical heart valve? no  Do you have a pacemaker/defibrillator?   no  Have you had endocarditis/atrial fibrillation?  no  Do you use supplemental oxygen/CPAP?  no  Have you had joint replacement within the last 12 months?  no  Do you tend to be constipated or have to use laxatives?  no   Do you have history of alcohol use? If yes, how much and how often.  no  Do you have history or are you using drugs? If yes, what do are you  using?  no  Have you ever had a stroke/heart attack?  no  Have you ever had a heart or other vascular stent placed,?no  Do you take weight loss medication? no  Do you take any blood-thinning medications such as: (Plavix, aspirin, Coumadin, Aggrenox, Brilinta, Xarelto, Eliquis, Pradaxa, Savaysa or Effient)? aspirin  If yes we need the name, milligram, dosage and who is prescribing doctor:               Current Outpatient Medications  Medication Sig Dispense Refill   alfuzosin  (UROXATRAL ) 10 MG 24 hr tablet Take 1 tablet (10 mg total) by mouth at bedtime. 30 tablet 11   aspirin 81 MG chewable tablet Chew 81 mg by mouth daily.     fenofibrate micronized (LOFIBRA) 200 MG capsule SMARTSIG:1 Capsule(s) By Mouth     levothyroxine  (SYNTHROID) 100 MCG tablet Take 100 mcg by mouth daily before breakfast.     lisinopril  (ZESTRIL ) 10 MG tablet Take 5 mg by mouth daily.     lovastatin  (MEVACOR ) 20 MG tablet Take 20 mg by mouth daily.     Omega-3 Fatty Acids (FISH OIL PO) Take by mouth daily.     No current facility-administered medications for this visit.    No Known Allergies

## 2024-05-04 NOTE — Telephone Encounter (Signed)
ASA 2. Okay to schedule. 

## 2024-05-05 NOTE — Telephone Encounter (Signed)
 LMOVM to return call.

## 2024-05-17 ENCOUNTER — Encounter: Payer: Self-pay | Admitting: *Deleted

## 2024-05-17 ENCOUNTER — Encounter (INDEPENDENT_AMBULATORY_CARE_PROVIDER_SITE_OTHER): Payer: Self-pay | Admitting: *Deleted

## 2024-05-17 NOTE — Telephone Encounter (Signed)
 Referral completed, TCS apt letter sent to PCP

## 2024-05-17 NOTE — Telephone Encounter (Signed)
 Pt has been scheduled for 06/10/24 with Dr.Carver. Pt states that the prep last time made him sick. He asked for something different. I told him he could do miralax prep. He was ok with that. Instructions mailed

## 2024-06-08 ENCOUNTER — Encounter (HOSPITAL_COMMUNITY)
Admission: RE | Admit: 2024-06-08 | Discharge: 2024-06-08 | Disposition: A | Discharge status: Home / Self Care | Source: Ambulatory Visit | Attending: Internal Medicine | Admitting: Internal Medicine

## 2024-06-08 ENCOUNTER — Other Ambulatory Visit: Payer: Self-pay

## 2024-06-08 ENCOUNTER — Encounter (HOSPITAL_COMMUNITY): Payer: Self-pay

## 2024-06-08 HISTORY — DX: Personal history of urinary calculi: Z87.442

## 2024-06-10 ENCOUNTER — Encounter (HOSPITAL_COMMUNITY)
Admission: RE | Disposition: A | Discharge status: Home / Self Care | Payer: Self-pay | Source: Ambulatory Visit | Attending: Internal Medicine

## 2024-06-10 ENCOUNTER — Ambulatory Visit (HOSPITAL_COMMUNITY): Admitting: Anesthesiology

## 2024-06-10 ENCOUNTER — Ambulatory Visit (HOSPITAL_COMMUNITY)
Admission: RE | Admit: 2024-06-10 | Discharge: 2024-06-10 | Disposition: A | Discharge status: Home / Self Care | Source: Ambulatory Visit | Attending: Internal Medicine | Admitting: Internal Medicine

## 2024-06-10 DIAGNOSIS — D123 Benign neoplasm of transverse colon: Secondary | ICD-10-CM | POA: Diagnosis not present

## 2024-06-10 DIAGNOSIS — M1990 Unspecified osteoarthritis, unspecified site: Secondary | ICD-10-CM | POA: Insufficient documentation

## 2024-06-10 DIAGNOSIS — K648 Other hemorrhoids: Secondary | ICD-10-CM | POA: Diagnosis not present

## 2024-06-10 DIAGNOSIS — Z1211 Encounter for screening for malignant neoplasm of colon: Secondary | ICD-10-CM | POA: Diagnosis not present

## 2024-06-10 DIAGNOSIS — I10 Essential (primary) hypertension: Secondary | ICD-10-CM | POA: Insufficient documentation

## 2024-06-10 DIAGNOSIS — Z8546 Personal history of malignant neoplasm of prostate: Secondary | ICD-10-CM | POA: Insufficient documentation

## 2024-06-10 DIAGNOSIS — K5730 Diverticulosis of large intestine without perforation or abscess without bleeding: Secondary | ICD-10-CM | POA: Diagnosis not present

## 2024-06-10 DIAGNOSIS — D124 Benign neoplasm of descending colon: Secondary | ICD-10-CM | POA: Diagnosis not present

## 2024-06-10 DIAGNOSIS — Z79899 Other long term (current) drug therapy: Secondary | ICD-10-CM | POA: Diagnosis not present

## 2024-06-10 DIAGNOSIS — K219 Gastro-esophageal reflux disease without esophagitis: Secondary | ICD-10-CM | POA: Insufficient documentation

## 2024-06-10 HISTORY — PX: COLONOSCOPY: SHX5424

## 2024-06-10 SURGERY — COLONOSCOPY
Anesthesia: General

## 2024-06-10 MED ORDER — LIDOCAINE 2% (20 MG/ML) 5 ML SYRINGE
INTRAMUSCULAR | Status: DC | PRN
Start: 2024-06-10 — End: 2024-06-10
  Administered 2024-06-10: 100 mg via INTRAVENOUS

## 2024-06-10 MED ORDER — PROPOFOL 500 MG/50ML IV EMUL
INTRAVENOUS | Status: DC | PRN
  Administered 2024-06-10: 150 ug/kg/min via INTRAVENOUS

## 2024-06-10 MED ORDER — LACTATED RINGERS IV SOLN: INTRAVENOUS | Status: DC

## 2024-06-10 MED ORDER — PROPOFOL 10 MG/ML IV BOLUS
INTRAVENOUS | Status: DC | PRN
  Administered 2024-06-10: 100 mg via INTRAVENOUS

## 2024-06-10 NOTE — Anesthesia Postprocedure Evaluation (Signed)
 Anesthesia Post Note  Patient: [name]  Procedure(s) Performed: COLONOSCOPY  Patient location during evaluation: Phase II Anesthesia Type: General Level of consciousness: awake and alert Pain management: pain level controlled Vital Signs Assessment: post-procedure vital signs reviewed and stable Respiratory status: spontaneous breathing, nonlabored ventilation and respiratory function stable Cardiovascular status: blood pressure returned to baseline and stable Postop Assessment: no apparent nausea or vomiting Anesthetic complications: no   There were no known notable events for this encounter.   Last Vitals:  Vitals:   06/10/24 0924 06/10/24 1017  BP: (!) 149/76 (!) 108/58  Pulse: (!) 54 (!) 57  Resp: 18 17  Temp: 36.6 C 36.6 C  SpO2: 100% 96%    Last Pain:  Vitals:   06/10/24 1017  TempSrc: Axillary  PainSc: 0-No pain                 Chelse Matas L Isolde Skaff

## 2024-06-10 NOTE — Transfer of Care (Signed)
 Immediate Anesthesia Transfer of Care Note  Patient: [name]  Procedure(s) Performed: COLONOSCOPY  Patient Location: Short Stay  Anesthesia Type:General  Level of Consciousness: drowsy  Airway & Oxygen Therapy: Patient Spontanous Breathing  Post-op Assessment: Report given to RN and Post -op Vital signs reviewed and stable  Post vital signs: Reviewed and stable  Last Vitals:  Vitals Value Taken Time  BP    Temp    Pulse    Resp    SpO2      Last Pain:  Vitals:   06/10/24 0955  TempSrc:   PainSc: 0-No pain      Patients Stated Pain Goal: 6 (06/10/24 0924)  Complications: No notable events documented.

## 2024-06-10 NOTE — Op Note (Signed)
 [facility] Patient Name: [name] Procedure Date: 06/10/2024 9:46 AM MRN: [id number] Date of Birth: 01/22/1956 Attending MD: Rolando Cliche. Mordechai April , [place], [phone] CSN: [id number] Age: 68 Admit Type: Outpatient Procedure:                Colonoscopy Indications:              Screening for colorectal malignant neoplasm Providers:                Rolando Cliche. Mordechai April, DO, Troy Furnish. Hazeline Lister RN, RN,                            Theola Fitch Referring MD:              Medicines:                See the Anesthesia note for documentation of the                            administered medications Complications:            No immediate complications. Estimated Blood Loss:     Estimated blood loss was minimal. Procedure:                Pre-Anesthesia Assessment:                           - The anesthesia plan was to use monitored                            anesthesia care (MAC).                           After obtaining informed consent, the colonoscope                            was passed under direct vision. Throughout the                            procedure, the patient's blood pressure, pulse, and                            oxygen saturations were monitored continuously. The                            [id number]-[id number] ([id number]) was introduced through the                            anus and advanced to the the cecum, identified by                            appendiceal orifice and ileocecal valve. The                            colonoscopy was performed without difficulty. The                            patient tolerated the procedure well.  The quality                            of the bowel preparation was evaluated using the                            BBPS [facility] Bowel Preparation Scale) with scores                            of: Right Colon = 3, Transverse Colon = 3 and Left                            Colon = 3 (entire mucosa seen well with no residual                            staining,  small fragments of stool or opaque                            liquid). The total BBPS score equals 9. Scope In: 9:59:59 AM Scope Out: 10:13:49 AM Scope Withdrawal Time: 0 hours 10 minutes 9 seconds  Total Procedure Duration: 0 hours 13 minutes 50 seconds  Findings:      Non-bleeding internal hemorrhoids were found.      Multiple large-mouthed and small-mouthed diverticula were found in the       sigmoid colon and descending colon.      Three sessile polyps were found in the descending colon and transverse       colon. The polyps were 4 to 6 mm in size. These polyps were removed with       a cold snare. Resection and retrieval were complete.      The exam was otherwise without abnormality. Impression:               - Non-bleeding internal hemorrhoids.                           - Diverticulosis in the sigmoid colon and in the                            descending colon.                           - Three 4 to 6 mm polyps in the descending colon                            and in the transverse colon, removed with a cold                            snare. Resected and retrieved.                           - The examination was otherwise normal. Moderate Sedation:      Per Anesthesia Care Recommendation:           - Patient has a contact number available for  emergencies. The signs and symptoms of potential                            delayed complications were discussed with the                            patient. Return to normal activities tomorrow.                            Written discharge instructions were provided to the                            patient.                           - Resume previous diet.                           - Continue present medications.                           - Await pathology results.                           - Repeat colonoscopy in 5 years for surveillance.                           - Return to GI clinic PRN. Procedure  Code(s):        --- Professional ---                           [phone], Colonoscopy, flexible; with removal of                            tumor(s), polyp(s), or other lesion(s) by snare                            technique Diagnosis Code(s):        --- Professional ---                           Z12.11, Encounter for screening for malignant                            neoplasm of colon                           K64.8, Other hemorrhoids                           D12.4, Benign neoplasm of descending colon                           D12.3, Benign neoplasm of transverse colon (hepatic                            flexure or splenic flexure)  K57.30, Diverticulosis of large intestine without                            perforation or abscess without bleeding CPT copyright 2022 American Medical Association. All rights reserved. The codes documented in this report are preliminary and upon coder review may  be revised to meet current compliance requirements. Rolando Cliche. Mordechai April, DO Rolando Cliche. Mordechai April, DO 06/10/2024 10:15:39 AM This report has been signed electronically. Number of Addenda: 0

## 2024-06-10 NOTE — Anesthesia Procedure Notes (Signed)
 Date/Time: 06/10/2024 9:53 AM  Performed by: Sherwin Donate, CRNAPre-anesthesia Checklist: Patient identified, Emergency Drugs available, Suction available and Patient being monitored Patient Re-evaluated:Patient Re-evaluated prior to induction Oxygen Delivery Method: Nasal cannula Induction Type: IV induction Placement Confirmation: positive ETCO2

## 2024-06-10 NOTE — H&P (Signed)
 Primary Care Physician:  Ezell Hollow, NP Primary Gastroenterologist:  Dr. Mordechai April  Pre-Procedure History & Physical: HPI:  [name] is a 68 y.o. male is here for a colonoscopy for colon cancer screening purposes.  Patient denies any family history of colorectal cancer.    Past Medical History:  Diagnosis Date   GERD (gastroesophageal reflux disease)    History of gout    History of kidney stones    Hypertension    followed by pcp   (pt had nuclear study done 07-07-2013 in epic,  normal with no ishemia, ef 69%))   Nocturia    Prostate cancer [facility]) urologist--- McKenzie   dx 03/ 2022,  Stage T1c,  Gleason 3+4    Past Surgical History:  Procedure Laterality Date   COLONOSCOPY N/A 07/22/2013   Procedure: COLONOSCOPY;  Surgeon: Beau Bound, MD;  [facility];  Service: Gastroenterology;  Laterality: N/A;   CYSTOSCOPY N/A 04/25/2021   Procedure: CYSTOSCOPY FLEXIBLE;  Surgeon: Osborn Blaze, MD;  Location: [facility];  Service: Urology;  Laterality: N/A;  NO SEEDS FOUND IN BLADDER   CYSTOSCOPY/RETROGRADE/URETEROSCOPY  07-31-2009  @[facility]    RADIOACTIVE SEED IMPLANT N/A 04/25/2021   Procedure: RADIOACTIVE SEED IMPLANT/BRACHYTHERAPY IMPLANT;  Surgeon: Osborn Blaze, MD;  Location: [facility];  Service: Urology;  Laterality: N/A;  52  SEEDS IMPLANTED   SPACE OAR INSTILLATION N/A 04/25/2021   Procedure: SPACE OAR INSTILLATION;  Surgeon: Osborn Blaze, MD;  Location: [facility];  Service: Urology;  Laterality: N/A;    Prior to Admission medications   Medication Sig Start Date End Date Taking? Authorizing Provider  alfuzosin  (UROXATRAL ) 10 MG 24 hr tablet Take 1 tablet (10 mg total) by mouth at bedtime. 03/16/24  Yes McKenzie, Arden Beck, MD  aspirin 81 MG chewable tablet Chew 81 mg by mouth daily.   Yes [provider]  fenofibrate micronized (LOFIBRA) 200 MG capsule SMARTSIG:1 Capsule(s) By Mouth   Yes [provider]  levothyroxine  (SYNTHROID) 100 MCG tablet Take 100 mcg by mouth daily before breakfast. 09/12/21  Yes [provider]  lovastatin  (MEVACOR ) 20 MG tablet Take 20 mg by mouth daily. 11/21/20  Yes [provider]  Omega-3 Fatty Acids (FISH OIL PO) Take by mouth daily.   Yes [provider]  lisinopril  (ZESTRIL ) 10 MG tablet Take 5 mg by mouth daily.    [provider]    Allergies as of 05/17/2024   (No Known Allergies)    Family History  Problem Relation Age of Onset   Breast cancer Sister    Cervical cancer Sister    Prostate cancer Neg Hx    Colon cancer Neg Hx    Pancreatic cancer Neg Hx     Social History   Socioeconomic History   Marital status: Married    Spouse name: Not on file   Number of children: Not on file   Years of education: Not on file   Highest education level: Not on file  Occupational History   Not on file  Tobacco Use   Smoking status: Never   Smokeless tobacco: Never  Vaping Use   Vaping status: Never Used  Substance and Sexual Activity   Alcohol use: No   Drug use: Never   Sexual activity: Yes  Other Topics Concern   Not on file  Social History Narrative   Not on file   Social Drivers of Health   Financial Resource Strain: Low  Risk  (10/22/2021)   Overall Financial Resource Strain (CARDIA)    Difficulty of Paying Living Expenses: Not hard at all  Food Insecurity: No Food Insecurity (10/22/2021)   Hunger Vital Sign    Worried About Running Out of Food in the Last Year: Never true    Ran Out of Food in the Last Year: Never true  Transportation Needs: No Transportation Needs (10/22/2021)   PRAPARE - Administrator, Civil Service (Medical): No    Lack of Transportation (Non-Medical): No  Physical Activity: Sufficiently Active (10/22/2021)   Exercise Vital Sign    Days of Exercise per Week: 5 days    Minutes of Exercise per Session: 50 min  Stress: No Stress Concern Present  (10/22/2021)   [employer] of Occupational Health - Occupational Stress Questionnaire    Feeling of Stress : Only a little  Social Connections: Moderately Integrated (10/22/2021)   Social Connection and Isolation Panel    Frequency of Communication with Friends and Family: Three times a week    Frequency of Social Gatherings with Friends and Family: Three times a week    Attends Religious Services: Never    Active Member of Clubs or Organizations: No    Attends Engineer, structural: More than 4 times per year    Marital Status: Married  Catering manager Violence: Not At Risk (10/22/2021)   Humiliation, Afraid, Rape, and Kick questionnaire    Fear of Current or Ex-Partner: No    Emotionally Abused: No    Physically Abused: No    Sexually Abused: No    Review of Systems: See HPI, otherwise negative ROS  Physical Exam: Vital signs in last 24 hours:     General:   Alert,  Well-developed, well-nourished, pleasant and cooperative in NAD Head:  Normocephalic and atraumatic. Eyes:  Sclera clear, no icterus.   Conjunctiva pink. Ears:  Normal auditory acuity. Nose:  No deformity, discharge,  or lesions. Msk:  Symmetrical without gross deformities. Normal posture. Extremities:  Without clubbing or edema. Neurologic:  Alert and  oriented x4;  grossly normal neurologically. Skin:  Intact without significant lesions or rashes. Psych:  Alert and cooperative. Normal mood and affect.  Impression/Plan: [name] is here for a colonoscopy to be performed for colon cancer screening purposes.  The risks of the procedure including infection, bleed, or perforation as well as benefits, limitations, alternatives and imponderables have been reviewed with the patient. Questions have been answered. All parties agreeable.

## 2024-06-10 NOTE — Discharge Instructions (Addendum)
?  Colonoscopy ?Discharge Instructions ? ?Read the instructions outlined below and refer to this sheet in the next few weeks. These discharge instructions provide you with general information on caring for yourself after you leave the hospital. Your doctor may also give you specific instructions. While your treatment has been planned according to the most current medical practices available, unavoidable complications occasionally occur.  ? ?ACTIVITY ?You may resume your regular activity, but move at a slower pace for the next 24 hours.  ?Take frequent rest periods for the next 24 hours.  ?Walking will help get rid of the air and reduce the bloated feeling in your belly (abdomen).  ?No driving for 24 hours (because of the medicine (anesthesia) used during the test).   ?Do not sign any important legal documents or operate any machinery for 24 hours (because of the anesthesia used during the test).  ?NUTRITION ?Drink plenty of fluids.  ?You may resume your normal diet as instructed by your doctor.  ?Begin with a light meal and progress to your normal diet. Heavy or fried foods are harder to digest and may make you feel sick to your stomach (nauseated).  ?Avoid alcoholic beverages for 24 hours or as instructed.  ?MEDICATIONS ?You may resume your normal medications unless your doctor tells you otherwise.  ?WHAT YOU CAN EXPECT TODAY ?Some feelings of bloating in the abdomen.  ?Passage of more gas than usual.  ?Spotting of blood in your stool or on the toilet paper.  ?IF YOU HAD POLYPS REMOVED DURING THE COLONOSCOPY: ?No aspirin products for 7 days or as instructed.  ?No alcohol for 7 days or as instructed.  ?Eat a soft diet for the next 24 hours.  ?FINDING OUT THE RESULTS OF YOUR TEST ?Not all test results are available during your visit. If your test results are not back during the visit, make an appointment with your caregiver to find out the results. Do not assume everything is normal if you have not heard from your  caregiver or the medical facility. It is important for you to follow up on all of your test results.  ?SEEK IMMEDIATE MEDICAL ATTENTION IF: ?You have more than a spotting of blood in your stool.  ?Your belly is swollen (abdominal distention).  ?You are nauseated or vomiting.  ?You have a temperature over 101.  ?You have abdominal pain or discomfort that is severe or gets worse throughout the day.  ? ?Your colonoscopy revealed 3 polyp(s) which I removed successfully. Await pathology results, my office will contact you. I recommend repeating colonoscopy in 5 years for surveillance purposes. You also have diverticulosis and internal hemorrhoids. I would recommend increasing fiber in your diet or adding OTC Benefiber/Metamucil. Be sure to drink at least 4 to 6 glasses of water daily. Follow-up with GI as needed. ? ? ? ?I hope you have a great rest of your week! ? ?Charles K. Carver, D.O. ?Gastroenterology and Hepatology ?[facility] ? ?

## 2024-06-10 NOTE — Anesthesia Preprocedure Evaluation (Signed)
 Anesthesia Evaluation  Patient identified by MRN, date of birth, ID band Patient awake    Reviewed: Allergy & Precautions, NPO status , Patient's Chart, lab work & pertinent test results  History of Anesthesia Complications Negative for: history of anesthetic complications  Airway Mallampati: III  TM Distance: >3 FB Neck ROM: Full    Dental  (+) Dental Advisory Given, Teeth Intact   Pulmonary neg pulmonary ROS   Pulmonary exam normal        Cardiovascular hypertension, Pt. on medications Normal cardiovascular exam     Neuro/Psych negative neurological ROS  negative psych ROS   GI/Hepatic Neg liver ROS,GERD  Controlled,,  Endo/Other  negative endocrine ROS    Renal/GU negative Renal ROS    Prostate cancer     Musculoskeletal  (+) Arthritis , Osteoarthritis,    Abdominal   Peds  Hematology negative hematology ROS (+)   Anesthesia Other Findings Covid test negative Prostate cancer   Reproductive/Obstetrics                             Anesthesia Physical Anesthesia Plan  ASA: 2  Anesthesia Plan: General   Post-op Pain Management:    Induction: Intravenous  PONV Risk Score and Plan: Propofol  infusion  Airway Management Planned: Nasal Cannula and Natural Airway  Additional Equipment: None  Intra-op Plan:   Post-operative Plan: Extubation in OR  Informed Consent: I have reviewed the patients History and Physical, chart, labs and discussed the procedure including the risks, benefits and alternatives for the proposed anesthesia with the patient or authorized representative who has indicated his/her understanding and acceptance.     Dental advisory given  Plan Discussed with: CRNA  Anesthesia Plan Comments:         Anesthesia Quick Evaluation

## 2024-06-13 ENCOUNTER — Encounter (HOSPITAL_COMMUNITY): Payer: Self-pay | Admitting: Internal Medicine

## 2024-06-13 LAB — SURGICAL PATHOLOGY

## 2024-06-20 ENCOUNTER — Ambulatory Visit: Payer: Self-pay | Admitting: Internal Medicine

## 2024-10-24 NOTE — Progress Notes (Unsigned)
 GI Office Note    Referring Provider: Joshua Rayfield RAMAN, NP Primary Care Physician:  Joshua Rayfield RAMAN, NP Primary Gastroenterologist: Carlin POUR. Cindie, DO  Date:  10/25/2024  ID:  [name], DOB February 24, 1956, MRN [id number]   Chief Complaint   Chief Complaint  Patient presents with   Abdominal Pain    Stomach pains, thinks diverticulosis is acting up. Having problems swallowing. Having some heartburn issues    History of Present Illness  [name] is a 68 y.o. male with a history of prostate cancer, HTN, kidney stones, and GERD presenting today with complaint of abdominal pain, dysphagia, and GERD.  Colonoscopy July 2014: - Mild sigmoid diverticulosis - Colon otherwise normal - Repeat in 10 years  Colonoscopy June 2025: - Non- bleeding internal hemorrhoids.  - Diverticulosis in the sigmoid colon and in the descending colon.  - Three 4 to 6 mm polyps in the descending colon and in the transverse colon - The examination was otherwise normal. - Path: tubular adenomas - Repeat in 5 years.   Today:   Discussed the use of AI scribe software for clinical note transcription with the patient, who gave verbal consent to proceed.  He experienced severe abdominal pain approximately two weeks ago, started on a Friday evening, primarily on the right side but all the ways across the lower abdomen, lasting for about a day. The pain was sharp and severe, prompting consideration of an emergency room visit, although he ultimately did not go. The pain subsided by Sunday morning, and he has not experienced similar pain since. He has been cautious with his diet, avoiding fried foods, and has not had any further episodes of pain.  He experiences indigestion and acid reflux, which he manages with Tums. Heartburn occurs with various foods, and although generally tolerable, he uses Tums multiple times a week. About a month ago, he felt as though food was getting 'stuck' in his chest, but  this symptom has since resolved. No blood or black stools have been noted.  His dietary habits include frequent consumption of grilled chicken salads with tomatoes, which he suspects might contribute to his symptoms. He avoids dairy products as they tend to cause gastrointestinal symptoms, such as increased bowel movements and urgency. Salads and certain foods like ice cream can trigger gastrointestinal symptoms.  He typically has a bowel movement at least once a day, and he has not experienced diarrhea or constipation during the recent episode of abdominal pain. He has not had any recent abdominal surgeries.      Wt Readings from Last 6 Encounters:  10/25/24 197 lb 6.4 oz (89.5 kg)  06/08/24 195 lb (88.5 kg)  05/30/22 196 lb 4.8 oz (89 kg)  04/25/21 196 lb 4.8 oz (89 kg)  04/24/21 190 lb (86.2 kg)  02/22/21 187 lb (84.8 kg)    Body mass index is 30.01 kg/m.   Current Outpatient Medications  Medication Sig Dispense Refill   alfuzosin  (UROXATRAL ) 10 MG 24 hr tablet Take 1 tablet (10 mg total) by mouth at bedtime. (Patient taking differently: Take 10 mg by mouth at bedtime. Every other day) 30 tablet 11   aspirin 81 MG chewable tablet Chew 81 mg by mouth daily.     fenofibrate micronized (LOFIBRA) 200 MG capsule SMARTSIG:1 Capsule(s) By Mouth     levothyroxine  (SYNTHROID) 100 MCG tablet Take 100 mcg by mouth daily before breakfast.     lisinopril  (ZESTRIL ) 10 MG tablet Take 5 mg by mouth daily.  lovastatin  (MEVACOR ) 20 MG tablet Take 20 mg by mouth daily.     Omega-3 Fatty Acids (FISH OIL PO) Take by mouth daily.     No current facility-administered medications for this visit.    Past Medical History:  Diagnosis Date   GERD (gastroesophageal reflux disease)    History of gout    History of kidney stones    Hypertension    followed by pcp   (pt had nuclear study done 07-07-2013 in epic,  normal with no ishemia, ef 69%))   Nocturia    Prostate cancer [facility]) urologist---  McKenzie   dx 03/ 2022,  Stage T1c,  Gleason 3+4    Past Surgical History:  Procedure Laterality Date   COLONOSCOPY N/A 07/22/2013   Procedure: COLONOSCOPY;  Surgeon: Oneil DELENA Budge, MD;  [facility];  Service: Gastroenterology;  Laterality: N/A;   COLONOSCOPY N/A 06/10/2024   Procedure: COLONOSCOPY;  Surgeon: Cindie Carlin POUR, DO;  [facility];  Service: Endoscopy;  Laterality: N/A;  10:45 am, asa 2   CYSTOSCOPY N/A 04/25/2021   Procedure: CYSTOSCOPY FLEXIBLE;  Surgeon: Alvaro Hummer, MD;  Location: [facility];  Service: Urology;  Laterality: N/A;  NO SEEDS FOUND IN BLADDER   CYSTOSCOPY/RETROGRADE/URETEROSCOPY  07-31-2009  @[facility]    RADIOACTIVE SEED IMPLANT N/A 04/25/2021   Procedure: RADIOACTIVE SEED IMPLANT/BRACHYTHERAPY IMPLANT;  Surgeon: Alvaro Hummer, MD;  Location: [facility];  Service: Urology;  Laterality: N/A;  53  SEEDS IMPLANTED   SPACE OAR INSTILLATION N/A 04/25/2021   Procedure: SPACE OAR INSTILLATION;  Surgeon: Alvaro Hummer, MD;  Location: [facility];  Service: Urology;  Laterality: N/A;    Family History  Problem Relation Age of Onset   Breast cancer Sister    Cervical cancer Sister    Prostate cancer Neg Hx    Colon cancer Neg Hx    Pancreatic cancer Neg Hx     Allergies as of 10/25/2024   (No Known Allergies)    Social History   Socioeconomic History   Marital status: Married    Spouse name: Not on file   Number of children: Not on file   Years of education: Not on file   Highest education level: Not on file  Occupational History   Not on file  Tobacco Use   Smoking status: Never   Smokeless tobacco: Never  Vaping Use   Vaping status: Never Used  Substance and Sexual Activity   Alcohol use: No   Drug use: Never   Sexual activity: Yes  Other Topics Concern   Not on file  Social History Narrative   Not on file   Social Drivers of Health   Financial Resource Strain: Low  Risk  (10/22/2021)   Overall Financial Resource Strain (CARDIA)    Difficulty of Paying Living Expenses: Not hard at all  Food Insecurity: No Food Insecurity (10/22/2021)   Hunger Vital Sign    Worried About Running Out of Food in the Last Year: Never true    Ran Out of Food in the Last Year: Never true  Transportation Needs: No Transportation Needs (10/22/2021)   PRAPARE - Administrator, Civil Service (Medical): No    Lack of Transportation (Non-Medical): No  Physical Activity: Sufficiently Active (10/22/2021)   Exercise Vital Sign    Days of Exercise per Week: 5 days    Minutes of Exercise per Session: 50 min  Stress: No Stress Concern Present (10/22/2021)  [employer] of Occupational Health - Occupational Stress Questionnaire    Feeling of Stress : Only a little  Social Connections: Moderately Integrated (10/22/2021)   Social Connection and Isolation Panel    Frequency of Communication with Friends and Family: Three times a week    Frequency of Social Gatherings with Friends and Family: Three times a week    Attends Religious Services: Never    Active Member of Clubs or Organizations: No    Attends Engineer, Structural: More than 4 times per year    Marital Status: Married   Review of Systems   Gen: Denies fever, chills, anorexia. Denies fatigue, weakness, weight loss.  CV: Denies chest pain, palpitations, syncope, peripheral edema, and claudication. Resp: Denies dyspnea at rest, cough, wheezing, coughing up blood, and pleurisy. GI: See HPI Derm: Denies rash, itching, dry skin Psych: Denies depression, anxiety, memory loss, confusion. No homicidal or suicidal ideation.  Heme: Denies bruising, bleeding, and enlarged lymph nodes.  Physical Exam   BP 128/64 (BP Location: Right Arm, Patient Position: Sitting, Cuff Size: Large)   Pulse (!) 107   Temp 97.7 F (36.5 C) (Temporal)   Ht 5' 8 (1.727 m)   Wt 197 lb 6.4 oz (89.5 kg)   BMI 30.01 kg/m    General:   Alert and oriented. No distress noted. Pleasant and cooperative.  Head:  Normocephalic and atraumatic. Eyes:  Conjuctiva clear without scleral icterus. Mouth:  Oral mucosa pink and moist. Good dentition. No lesions. Abdomen:  +BS, soft, non-tender and non-distended, rounded/protuberant. No rebound or guarding. No HSM or masses noted. Rectal: deferred Msk:  Symmetrical without gross deformities. Normal posture. Extremities:  Without edema. Neurologic:  Alert and  oriented x4 Psych:  Alert and cooperative. Normal mood and affect.  Assessment & Plan  KELTON BULTMAN is a 68 y.o. male presenting today with concerns abotu recent abdominal pain and issues with reflux and dysphagia.     Gastroesophageal reflux disease (GERD), mild dysphagia Intermittent acid reflux symptoms managed with Tums, occurring multiple times a week. Symptoms are tolerable but may be exacerbated by dietary choices such as tomatoes and fried foods. Differential diagnosis includes H. pylori infection, which can cause reflux symptoms. Had one episode of dysphagia with food feeling stuck in his mid-chest about 1 month ago.  - Ordered H. pylori breath test with instructions to fast for at least one hour prior to the test. - Start famotidine 20 mg once daily after H. pylori test performed. - Will consider omeprazole or pantoprazole if symptoms do not improve with famotidine and /or consider EGD especially if having ongoing dysphagia.   Diverticulosis of colon Diverticulosis present, noted on recent colonoscopy in June 2025. No current symptoms suggestive of diverticulitis, no ongoing pain or fevers. Discussed dietary management to prevent constipation and potential triggers for diverticulitis. Emphasized the importance of fiber intake to prevent diverticulitis. Currently with daily BM without need to strain (no overt constipation). No rectal bleeding. Benign abdominal exam.  - Advised high-fiber diet to prevent  constipation. - Avoid foods that cause discomfort, such as dairy and certain salads.  Recent resolved episode of abdominal pain and nausea, likely viral gastroenteritis Recent episode of severe right-sided abdominal pain and nausea lasting over 24 hours, likely viral gastroenteritis. Symptoms have resolved. No recurrence of symptoms. Discussed potential triggers and dietary management. Advised to monitor for recurrence and seek medical attention if symptoms return. - Monitor for recurrence of symptoms. - If symptoms recur, seek medical attention  if lasting more than 48 hours, especially if severe for evaluation by CT scan. - Consider anti-gas medication for discomfort if symptoms recur.     Follow up   Follow up 6 weeks.     Charmaine Melia, MSN, FNP-BC, AGACNP-BC [facility]

## 2024-10-25 ENCOUNTER — Ambulatory Visit (INDEPENDENT_AMBULATORY_CARE_PROVIDER_SITE_OTHER): Admitting: Gastroenterology

## 2024-10-25 ENCOUNTER — Encounter: Payer: Self-pay | Admitting: Gastroenterology

## 2024-10-25 VITALS — BP 128/64 | HR 107 | Temp 97.7°F | Ht 68.0 in | Wt 197.4 lb

## 2024-10-25 DIAGNOSIS — K219 Gastro-esophageal reflux disease without esophagitis: Secondary | ICD-10-CM

## 2024-10-25 DIAGNOSIS — R110 Nausea: Secondary | ICD-10-CM

## 2024-10-25 DIAGNOSIS — K5730 Diverticulosis of large intestine without perforation or abscess without bleeding: Secondary | ICD-10-CM

## 2024-10-25 DIAGNOSIS — R1310 Dysphagia, unspecified: Secondary | ICD-10-CM | POA: Diagnosis not present

## 2024-10-25 DIAGNOSIS — A084 Viral intestinal infection, unspecified: Secondary | ICD-10-CM | POA: Diagnosis not present

## 2024-10-25 NOTE — Patient Instructions (Addendum)
 Please have the H. pylori breath test completed at [facility].  We will call you with results once they have been received. Please allow 3-5 business days for review. Location for [facility] in [place]:            [address], [place], [place]  After you take the breath test you may go ahead and start famotidine 20 mg once daily, 30 minutes prior to breakfast.  If after treatment of your reflux or still having issues with swallowing we can discuss an upper endoscopy.  Follow a GERD diet:  Avoid fried, fatty, greasy, spicy, citrus foods. Avoid caffeine and carbonated beverages. Avoid chocolate. Try eating 4-6 small meals a day rather than 3 large meals. Do not eat within 3 hours of laying down. Prop head of bed up on wood or bricks to create a 6 inch incline.  I attached some information for you regarding diverticulosis and the importance of a high-fiber diet.  If you have pain that occurs again that last more than 48 hours or is very severe in nature and unable to keep down anything orally please go to either the ED or give our office a call.  If needed you could try some Gas-X or Phazyme to help with any abdominal discomfort but could be secondary to gas.  Follow-up in 6 weeks.  It was a pleasure to see you today. I want to create trusting relationships with patients. If you receive a survey regarding your visit,  I greatly appreciate you taking time to fill this out on paper or through your MyChart. I value your feedback.  Charmaine Melia, MSN, FNP-BC, AGACNP-BC [facility]

## 2024-11-12 LAB — H. PYLORI BREATH TEST: H pylori Breath Test: NEGATIVE

## 2024-11-13 ENCOUNTER — Ambulatory Visit: Payer: Self-pay | Admitting: Gastroenterology

## 2025-03-13 ENCOUNTER — Other Ambulatory Visit

## 2025-03-17 ENCOUNTER — Ambulatory Visit: Admitting: Urology
